# Patient Record
Sex: Female | Born: 1975 | Race: Black or African American | Hispanic: No | Marital: Single | Smoking: Former smoker
Health system: Southern US, Community
[De-identification: ages and names within clinical notes are randomized; demographics above are authoritative.]

## PROBLEM LIST (undated history)

## (undated) ENCOUNTER — Emergency Department (HOSPITAL_COMMUNITY): Discharge status: Absent without leave | Payer: Self-pay

## (undated) DIAGNOSIS — I10 Essential (primary) hypertension: Secondary | ICD-10-CM

## (undated) DIAGNOSIS — G43909 Migraine, unspecified, not intractable, without status migrainosus: Secondary | ICD-10-CM

## (undated) HISTORY — PX: NO PAST SURGERIES: SHX2092

## (undated) HISTORY — DX: Essential (primary) hypertension: I10

---

## 2012-08-14 ENCOUNTER — Emergency Department (HOSPITAL_COMMUNITY): Payer: Self-pay

## 2012-08-14 ENCOUNTER — Emergency Department (HOSPITAL_COMMUNITY)
Admission: EM | Admit: 2012-08-14 | Discharge: 2012-08-14 | Disposition: A | Discharge status: Home / Self Care | Payer: Self-pay | Attending: Emergency Medicine | Admitting: Emergency Medicine

## 2012-08-14 ENCOUNTER — Encounter (HOSPITAL_COMMUNITY): Payer: Self-pay | Admitting: Emergency Medicine

## 2012-08-14 DIAGNOSIS — Z3202 Encounter for pregnancy test, result negative: Secondary | ICD-10-CM | POA: Insufficient documentation

## 2012-08-14 DIAGNOSIS — N949 Unspecified condition associated with female genital organs and menstrual cycle: Secondary | ICD-10-CM | POA: Insufficient documentation

## 2012-08-14 DIAGNOSIS — R109 Unspecified abdominal pain: Secondary | ICD-10-CM | POA: Insufficient documentation

## 2012-08-14 LAB — CBC WITH DIFFERENTIAL/PLATELET
Basophils Absolute: 0.1 10*3/uL (ref 0.0–0.1)
Eosinophils Relative: 3 % (ref 0–5)
HCT: 31.1 % — ABNORMAL LOW (ref 36.0–46.0)
Lymphocytes Relative: 51 % — ABNORMAL HIGH (ref 12–46)
Lymphs Abs: 2.6 10*3/uL (ref 0.7–4.0)
MCV: 86.6 fL (ref 78.0–100.0)
Neutro Abs: 1.9 10*3/uL (ref 1.7–7.7)
Platelets: 290 10*3/uL (ref 150–400)
RBC: 3.59 MIL/uL — ABNORMAL LOW (ref 3.87–5.11)
RDW: 14.1 % (ref 11.5–15.5)
WBC: 5.2 10*3/uL (ref 4.0–10.5)

## 2012-08-14 LAB — COMPREHENSIVE METABOLIC PANEL
ALT: 13 U/L (ref 0–35)
AST: 22 U/L (ref 0–37)
Alkaline Phosphatase: 43 U/L (ref 39–117)
CO2: 23 mEq/L (ref 19–32)
Calcium: 9 mg/dL (ref 8.4–10.5)
Chloride: 105 mEq/L (ref 96–112)
GFR calc Af Amer: >90 mL/min (ref >90)
GFR calc non Af Amer: 89 mL/min — ABNORMAL LOW (ref >90)
Glucose, Bld: 98 mg/dL (ref 70–99)
Sodium: 137 mEq/L (ref 135–145)
Total Bilirubin: 0.2 mg/dL — ABNORMAL LOW (ref 0.3–1.2)

## 2012-08-14 LAB — WET PREP, GENITAL
Clue Cells Wet Prep HPF POC: NONE SEEN
Trich, Wet Prep: NONE SEEN
Yeast Wet Prep HPF POC: NONE SEEN

## 2012-08-14 LAB — URINALYSIS, ROUTINE W REFLEX MICROSCOPIC
Bilirubin Urine: NEGATIVE
Glucose, UA: NEGATIVE mg/dL
Hgb urine dipstick: NEGATIVE
Specific Gravity, Urine: 1.014 (ref 1.005–1.030)
Urobilinogen, UA: 0.2 mg/dL (ref 0.0–1.0)
pH: 7 (ref 5.0–8.0)

## 2012-08-14 LAB — GC/CHLAMYDIA PROBE AMP
CT Probe RNA: NEGATIVE
GC Probe RNA: NEGATIVE

## 2012-08-14 MED ORDER — HYDROMORPHONE HCL PF 1 MG/ML IJ SOLN
INTRAMUSCULAR | Status: AC
  Administered 2012-08-14: 02:00:00
  Filled 2012-08-14: qty 1

## 2012-08-14 MED ORDER — ONDANSETRON HCL 4 MG/2ML IJ SOLN
4.0000 mg | Freq: Once | INTRAMUSCULAR | Status: AC
  Administered 2012-08-14: 4 mg via INTRAVENOUS
  Filled 2012-08-14: qty 2

## 2012-08-14 MED ORDER — HYDROCODONE-ACETAMINOPHEN 5-325 MG PO TABS: 2.0000 | ORAL_TABLET | ORAL | Status: DC | PRN

## 2012-08-14 MED ORDER — PROMETHAZINE HCL 25 MG PO TABS: 25.0000 mg | ORAL_TABLET | Freq: Four times a day (QID) | ORAL | Status: DC | PRN

## 2012-08-14 MED ORDER — SODIUM CHLORIDE 0.9 % IV SOLN
Freq: Once | INTRAVENOUS | Status: AC
  Administered 2012-08-14: 01:00:00 via INTRAVENOUS

## 2012-08-14 NOTE — ED Provider Notes (Signed)
CSN: [id number]     Arrival date & time 08/14/12  0011 History     First MD Initiated Contact with Patient 08/14/12 0155     Chief Complaint  Patient presents with  . Abdominal Pain   (Consider location/radiation/quality/duration/timing/severity/associated sxs/prior Treatment) HPI  37 year old female presents complaining of pelvic and abdominal pain. Patient reports acute onset of sharp stabbing pain from her suprapubic region which radiates up to the upper abdomen tonight while she was sleeping. The pain is intense, worsening with movement. She has never felt this pain before. No specific treatment tried. No complaints of fever, chills, nausea, vomiting, diarrhea, chest pain, shortness of breath, back pain, dysuria, hematuria, hematochezia, melena, or rash. Denies any recent trauma. Denies any pain with sexual activities. Last menstrual period was a week ago. Patient is sexually active.  No past medical history on file. No past surgical history on file. No family history on file. History  Substance Use Topics  . Smoking status: Not on file  . Smokeless tobacco: Not on file  . Alcohol Use: Not on file   OB History   No data available     Review of Systems  All other systems reviewed and are negative.    Allergies  Review of patient's allergies indicates no known allergies.  Home Medications  No current outpatient prescriptions on file. BP 137/96  Pulse 50  Temp(Src) 98.1 F (36.7 C) (Oral)  Resp 18  SpO2 100% Physical Exam  Nursing note and vitals reviewed. Constitutional: She appears well-developed and well-nourished. No distress.  HENT:  Head: Normocephalic and atraumatic.  Eyes: Conjunctivae are normal.  Neck: Normal range of motion. Neck supple.  Cardiovascular: Normal rate and regular rhythm.   Pulmonary/Chest: Effort normal and breath sounds normal. She exhibits no tenderness.  Abdominal: Soft. There is tenderness (diffuse abdominal tenderness most  significant to suprapubic region with guarding but without rebound tenderness. No peritoneal sign.). There is guarding. There is no rebound.  Genitourinary: Vagina normal and uterus normal. There is no rash or lesion on the right labia. There is no rash or lesion on the left labia. Cervix exhibits motion tenderness. Cervix exhibits no discharge. Right adnexum displays tenderness. Right adnexum displays no mass. Left adnexum displays tenderness. Left adnexum displays no mass. No erythema, tenderness or bleeding around the vagina. No vaginal discharge found.  Chaperone present  Lymphadenopathy:       Right: No inguinal adenopathy present.       Left: No inguinal adenopathy present.    ED Course   Procedures (including critical care time)  Patient presents with low abdominal pain radiates up to the upper abdomen. She is moderate tender on exam most significant to the suprapubic region. Pain is reproducible on pelvic exam is well, to both adnexa and uterus. Plan to obtain pelvic ultrasound to rule out overian torsion, tubo-ovarian abscess. Low suspicion for biliary disease, or appendicitis at this time. Patient is afebrile with stable normal in sign. Patient's pain improves after receiving pain medication in ED.  3:38 AM Wet prep result unremarkable. Pregnancy test is negative. UA shows no evidence of urinary tract infection. Patient has normal white count. Mild anemia with hemoglobin of 10.2. Her electrolytes are normal. Normal lipase. Pelvic ultrasound shows no acute finding, specifically no evidence of ovarian torsion or tubo-ovarian abscess. Patient reports pain has improved. Patient still had some periumbilical pain on reexamination. No guarding no rebound. Although this may be early onset appendicitis, I do not think CT scan is  warranted at this time. Patient agrees to return for serial abdominal exam in 12-24 hours if symptoms worsen.  Labs Reviewed  WET PREP, GENITAL - Abnormal; Notable for the  following:    WBC, Wet Prep HPF POC FEW (*)    All other components within normal limits  CBC WITH DIFFERENTIAL - Abnormal; Notable for the following:    RBC 3.59 (*)    Hemoglobin 10.2 (*)    HCT 31.1 (*)    Neutrophils Relative % 37 (*)    Lymphocytes Relative 51 (*)    All other components within normal limits  COMPREHENSIVE METABOLIC PANEL - Abnormal; Notable for the following:    Albumin 3.4 (*)    Total Bilirubin 0.2 (*)    GFR calc non Af Amer 89 (*)    All other components within normal limits  GC/CHLAMYDIA PROBE AMP  LIPASE, BLOOD  PREGNANCY, URINE  URINALYSIS, ROUTINE W REFLEX MICROSCOPIC  URINALYSIS, ROUTINE W REFLEX MICROSCOPIC  POCT I-STAT TROPONIN I   US Transvaginal Non-ob  08/14/2012   *RADIOLOGY REPORT*  Clinical Data: Pelvic pain.  TRANSABDOMINAL AND TRANSVAGINAL ULTRASOUND OF PELVIS DOPPLER ULTRASOUND OF OVARIES  Technique:  Both transabdominal and transvaginal ultrasound examinations of the pelvis were performed. Transabdominal technique was performed for global imaging of the pelvis including uterus, ovaries, adnexal regions, and pelvic cul-de-sac.  Color and duplex Doppler ultrasound was utilized to evaluate blood flow to the ovaries.  It was necessary to proceed with endovaginal exam following the transabdominal exam to visualize the right ovary.  Comparison:  No priors.  Findings:  Uterus: Retroverted uterus, normal in size and echotexture measuring 7.8 x 5.1 x 5.9 cm.  Endometrium: 5.8 mm thick.  Right ovary:  Normal size and echotexture measuring 3.2 x 1.7 x 2.3 cm.  Multiple small follicles.  Normal arterial and venous flow to the ovary.  Left ovary: Normal size and echotexture measuring 4.0 x 2.1 x 2.8 cm.  Multiple small follicles.  Normal arterial and venous flow to the ovary.  Other findings: Small volume of free fluid the cul-de-sac.  IMPRESSION: 1. Small volume of free fluid the cul-de-sac is presumably physiologic in this young female patient.  No other acute  findings. 2.  Retroverted uterus. 3.  Normal appearance of the ovaries bilaterally, with normal arterial and venous flow.   Original Report Authenticated By: Trudie Reed, M.D.   US Pelvis Complete  08/14/2012   *RADIOLOGY REPORT*  Clinical Data: Pelvic pain.  TRANSABDOMINAL AND TRANSVAGINAL ULTRASOUND OF PELVIS DOPPLER ULTRASOUND OF OVARIES  Technique:  Both transabdominal and transvaginal ultrasound examinations of the pelvis were performed. Transabdominal technique was performed for global imaging of the pelvis including uterus, ovaries, adnexal regions, and pelvic cul-de-sac.  Color and duplex Doppler ultrasound was utilized to evaluate blood flow to the ovaries.  It was necessary to proceed with endovaginal exam following the transabdominal exam to visualize the right ovary.  Comparison:  No priors.  Findings:  Uterus: Retroverted uterus, normal in size and echotexture measuring 7.8 x 5.1 x 5.9 cm.  Endometrium: 5.8 mm thick.  Right ovary:  Normal size and echotexture measuring 3.2 x 1.7 x 2.3 cm.  Multiple small follicles.  Normal arterial and venous flow to the ovary.  Left ovary: Normal size and echotexture measuring 4.0 x 2.1 x 2.8 cm.  Multiple small follicles.  Normal arterial and venous flow to the ovary.  Other findings: Small volume of free fluid the cul-de-sac.  IMPRESSION: 1. Small volume of free  fluid the cul-de-sac is presumably physiologic in this young female patient.  No other acute findings. 2.  Retroverted uterus. 3.  Normal appearance of the ovaries bilaterally, with normal arterial and venous flow.   Original Report Authenticated By: Trudie Reed, M.D.   [place] Art/ven Flow Abd Pelv Doppler  08/14/2012   *RADIOLOGY REPORT*  Clinical Data: Pelvic pain.  TRANSABDOMINAL AND TRANSVAGINAL ULTRASOUND OF PELVIS DOPPLER ULTRASOUND OF OVARIES  Technique:  Both transabdominal and transvaginal ultrasound examinations of the pelvis were performed. Transabdominal technique was performed for  global imaging of the pelvis including uterus, ovaries, adnexal regions, and pelvic cul-de-sac.  Color and duplex Doppler ultrasound was utilized to evaluate blood flow to the ovaries.  It was necessary to proceed with endovaginal exam following the transabdominal exam to visualize the right ovary.  Comparison:  No priors.  Findings:  Uterus: Retroverted uterus, normal in size and echotexture measuring 7.8 x 5.1 x 5.9 cm.  Endometrium: 5.8 mm thick.  Right ovary:  Normal size and echotexture measuring 3.2 x 1.7 x 2.3 cm.  Multiple small follicles.  Normal arterial and venous flow to the ovary.  Left ovary: Normal size and echotexture measuring 4.0 x 2.1 x 2.8 cm.  Multiple small follicles.  Normal arterial and venous flow to the ovary.  Other findings: Small volume of free fluid the cul-de-sac.  IMPRESSION: 1. Small volume of free fluid the cul-de-sac is presumably physiologic in this young female patient.  No other acute findings. 2.  Retroverted uterus. 3.  Normal appearance of the ovaries bilaterally, with normal arterial and venous flow.   Original Report Authenticated By: Trudie Reed, M.D.   1. Abdominal pain     MDM  BP 135/89  Pulse 83  Temp(Src) 98.6 F (37 C) (Oral)  Resp 16  SpO2 98%  I have reviewed nursing notes and vital signs. I personally reviewed the imaging tests through PACS system  I reviewed available ER/hospitalization records thought the EMR   Fayrene Helper, [place] 08/14/12 0413

## 2012-08-14 NOTE — ED Notes (Signed)
See paper documentations during computer downtime.

## 2012-08-14 NOTE — ED Notes (Signed)
Brought in by EMS from home with c/o abdominal pain with N/V/D.  Per EMS, pt reported that she woke up an hour ago with severe epigastric pain and started to vomit; pt reports she went to bed feeling fine.  Pt presents to ED in fetal position, c/o severe epigastric pain.

## 2012-08-14 NOTE — ED Notes (Signed)
[id number]<AV> Expected date:<BR> Expected time:<BR> Means of arrival:<BR> Comments:<BR> EMS/female with upper abdominal pain

## 2012-08-14 NOTE — ED Provider Notes (Signed)
Medical screening examination/treatment/procedure(s) were performed by non-physician practitioner and as supervising physician I was immediately available for consultation/collaboration.  John-Adam Avett Reineck, M.D.     John-Adam Khaliel Morey, MD 08/14/12 0600 

## 2012-09-08 ENCOUNTER — Encounter (HOSPITAL_COMMUNITY): Payer: Self-pay | Admitting: *Deleted

## 2012-09-08 ENCOUNTER — Emergency Department (HOSPITAL_COMMUNITY)
Admission: EM | Admit: 2012-09-08 | Discharge: 2012-09-08 | Disposition: A | Discharge status: Home / Self Care | Payer: Medicaid Other | Attending: Emergency Medicine | Admitting: Emergency Medicine

## 2012-09-08 DIAGNOSIS — R112 Nausea with vomiting, unspecified: Secondary | ICD-10-CM | POA: Insufficient documentation

## 2012-09-08 DIAGNOSIS — F17200 Nicotine dependence, unspecified, uncomplicated: Secondary | ICD-10-CM | POA: Insufficient documentation

## 2012-09-08 DIAGNOSIS — R42 Dizziness and giddiness: Secondary | ICD-10-CM | POA: Insufficient documentation

## 2012-09-08 DIAGNOSIS — H538 Other visual disturbances: Secondary | ICD-10-CM | POA: Insufficient documentation

## 2012-09-08 DIAGNOSIS — G43809 Other migraine, not intractable, without status migrainosus: Secondary | ICD-10-CM | POA: Insufficient documentation

## 2012-09-08 DIAGNOSIS — Z79899 Other long term (current) drug therapy: Secondary | ICD-10-CM | POA: Insufficient documentation

## 2012-09-08 DIAGNOSIS — G43909 Migraine, unspecified, not intractable, without status migrainosus: Secondary | ICD-10-CM

## 2012-09-08 DIAGNOSIS — R110 Nausea: Secondary | ICD-10-CM

## 2012-09-08 DIAGNOSIS — H53149 Visual discomfort, unspecified: Secondary | ICD-10-CM | POA: Insufficient documentation

## 2012-09-08 DIAGNOSIS — Z8669 Personal history of other diseases of the nervous system and sense organs: Secondary | ICD-10-CM | POA: Insufficient documentation

## 2012-09-08 HISTORY — DX: Migraine, unspecified, not intractable, without status migrainosus: G43.909

## 2012-09-08 MED ORDER — METOCLOPRAMIDE HCL 5 MG/ML IJ SOLN
10.0000 mg | Freq: Once | INTRAMUSCULAR | Status: AC
  Administered 2012-09-08: 10 mg via INTRAVENOUS
  Filled 2012-09-08: qty 2

## 2012-09-08 MED ORDER — HYDROMORPHONE HCL PF 1 MG/ML IJ SOLN
1.0000 mg | Freq: Once | INTRAMUSCULAR | Status: AC
  Administered 2012-09-08: 1 mg via INTRAVENOUS
  Filled 2012-09-08: qty 1

## 2012-09-08 MED ORDER — SODIUM CHLORIDE 0.9 % IV BOLUS (SEPSIS)
1000.0000 mL | Freq: Once | INTRAVENOUS | Status: AC
  Administered 2012-09-08: 1000 mL via INTRAVENOUS

## 2012-09-08 NOTE — ED Notes (Signed)
Pt to ED for evaluation of a migraine since yesterday- pt has hx of migraines, took [facility] powder at home without relief.  Headache is located in the back of pt head- N/V associated with symptoms.  Denies any changes in vision- alert and oriented X 4 at present.  IV in place- will continue to monitor pt.

## 2012-09-08 NOTE — ED Provider Notes (Signed)
CSN: [id number]     Arrival date & time 09/08/12  1755 History   First MD Initiated Contact with Patient 09/08/12 1818     Chief Complaint  Patient presents with  . Migraine  . Emesis   (Consider location/radiation/quality/duration/timing/severity/associated sxs/prior Treatment) The history is provided by the patient and medical records.   Patient presents to the ED for migraine headache. Patient states headache started last night, progressively worsening, associated with some nausea, vomiting, lightheadedness, photophobia, and intermittently blurred vision.  States these sx occur with her migraines. Denies any aura, photophobia, tinnitus, confusion, difficulty concentrating, changes in speech, numbness or paresthesias of extremities. Patient has a history of migraines, not currently on any maintenance medications. She has taken [facility] powder at home without significant relief.  No fevers, sweats, or chills.  No neck pain or stiffness.  Past Medical History  Diagnosis Date  . Migraine    History reviewed. No pertinent past surgical history. No family history on file. History  Substance Use Topics  . Smoking status: Current Some Day Smoker  . Smokeless tobacco: Never Used  . Alcohol Use: No   OB History   Grav Para Term Preterm Abortions TAB SAB Ect Mult Living            1     Review of Systems  Gastrointestinal: Positive for nausea and vomiting.  Neurological: Positive for headaches.  All other systems reviewed and are negative.    Allergies  Review of patient's allergies indicates no known allergies.  Home Medications   Current Outpatient Rx  Name  Route  Sig  Dispense  Refill  . Aspirin-Salicylamide-Caffeine (BC HEADACHE POWDER PO)   Oral   Take 1 Package by mouth daily as needed (for migraines).          BP 161/101  Pulse 75  Temp(Src) 98.5 F (36.9 C) (Oral)  Resp 20  SpO2 99%  Physical Exam  Nursing note and vitals reviewed. Constitutional: She is oriented  to person, place, and time. She appears well-developed and well-nourished. No distress.  HENT:  Head: Normocephalic and atraumatic.  Mouth/Throat: Oropharynx is clear and moist.  Eyes: Conjunctivae and EOM are normal. Pupils are equal, round, and reactive to light.  Neck: Normal range of motion and full passive range of motion without pain. Neck supple. No rigidity.  No meningeal signs  Cardiovascular: Normal rate, regular rhythm and normal heart sounds.   Pulmonary/Chest: Effort normal and breath sounds normal. No respiratory distress. She has no wheezes.  Abdominal: Soft. Bowel sounds are normal. There is no tenderness. There is no guarding.  Musculoskeletal: Normal range of motion.  Neurological: She is alert and oriented to person, place, and time. She has normal strength. She displays no tremor. No cranial nerve deficit or sensory deficit. She displays no seizure activity. Gait normal.  CN grossly intact, moves all extremities appropriately without ataxia, no focal neuro deficits or facial droop appreciated; normal gait  Skin: Skin is warm and dry. She is not diaphoretic.  Psychiatric: She has a normal mood and affect.    ED Course  Procedures (including critical care time) Labs Review Labs Reviewed - No data to display Imaging Review No results found.  MDM   1. Migraine headache   2. Nausea     At initial evaluation pt in room with friend, talking on the phone, NAD.  No active vomiting in the ED.  Headache without associated focal neuro deficits-- i doubt TIA, stroke, SAH, ICH, or meningitis.  Will give migraine cocktail and reassess.  7:45 PM Pt reassessed.  Headache resolved, will initiate PO trial.   Pt tolerated PO without difficulty.  Pt afebrile, non-toxic appearing, NAD, VS stable- ok for discharge.  Instructed to take OTC Excedrin migraine if headache rebounds.  Discussed plan with pt, they agreed.  Return precautions advised.  Garlon Hatchet, PA-C 09/09/12 [phone]

## 2012-09-08 NOTE — ED Notes (Signed)
Pt has history of migraines and reports frontal headache last nite and vomiting

## 2012-09-09 NOTE — ED Provider Notes (Signed)
Medical screening examination/treatment/procedure(s) were performed by non-physician practitioner and as supervising physician I was immediately available for consultation/collaboration.  Aylen Rambert, MD 09/09/12 0107 

## 2012-12-15 ENCOUNTER — Emergency Department (HOSPITAL_COMMUNITY)
Admission: EM | Admit: 2012-12-15 | Discharge: 2012-12-15 | Disposition: A | Discharge status: Home / Self Care | Payer: Medicaid Other | Attending: Emergency Medicine | Admitting: Emergency Medicine

## 2012-12-15 ENCOUNTER — Encounter (HOSPITAL_COMMUNITY): Payer: Self-pay | Admitting: Emergency Medicine

## 2012-12-15 DIAGNOSIS — G43909 Migraine, unspecified, not intractable, without status migrainosus: Secondary | ICD-10-CM | POA: Insufficient documentation

## 2012-12-15 DIAGNOSIS — Z3202 Encounter for pregnancy test, result negative: Secondary | ICD-10-CM | POA: Insufficient documentation

## 2012-12-15 DIAGNOSIS — H53149 Visual discomfort, unspecified: Secondary | ICD-10-CM | POA: Insufficient documentation

## 2012-12-15 DIAGNOSIS — Z8669 Personal history of other diseases of the nervous system and sense organs: Secondary | ICD-10-CM | POA: Insufficient documentation

## 2012-12-15 DIAGNOSIS — R112 Nausea with vomiting, unspecified: Secondary | ICD-10-CM | POA: Insufficient documentation

## 2012-12-15 MED ORDER — PROCHLORPERAZINE EDISYLATE 5 MG/ML IJ SOLN: 10.0000 mg | Freq: Once | INTRAMUSCULAR | Status: DC

## 2012-12-15 MED ORDER — DIPHENHYDRAMINE HCL 50 MG/ML IJ SOLN: 25.0000 mg | Freq: Once | INTRAMUSCULAR | Status: DC

## 2012-12-15 MED ORDER — PROCHLORPERAZINE EDISYLATE 5 MG/ML IJ SOLN
10.0000 mg | Freq: Once | INTRAMUSCULAR | Status: AC
  Administered 2012-12-15: 10 mg via INTRAVENOUS
  Filled 2012-12-15: qty 2

## 2012-12-15 MED ORDER — DIPHENHYDRAMINE HCL 50 MG/ML IJ SOLN
25.0000 mg | Freq: Once | INTRAMUSCULAR | Status: AC
  Administered 2012-12-15: 25 mg via INTRAVENOUS
  Filled 2012-12-15: qty 1

## 2012-12-15 MED ORDER — SODIUM CHLORIDE 0.9 % IV BOLUS (SEPSIS)
1000.0000 mL | Freq: Once | INTRAVENOUS | Status: AC
  Administered 2012-12-15: 1000 mL via INTRAVENOUS

## 2012-12-15 NOTE — ED Provider Notes (Signed)
CSN: [id number]     Arrival date & time 12/15/12  4098 History   First MD Initiated Contact with Patient 12/15/12 [phone]     Chief Complaint  Patient presents with  . Migraine   (Consider location/radiation/quality/duration/timing/severity/associated sxs/prior Treatment) HPI Comments: Patient is a 37 yo F PMHx significant for migraines presenting to the ED for a moderate to severe migraine that started yesterday. The patient states her migraine is located on the left side of her head and feels "like someone is hammering my head." She endorses associated nausea and non-bloody non-bilious emesis yesterday. She also endorses photophobia. No alleviating factors. The patient states the last time she had a headache this bad was 3 weeks ago. Patient states this migraine feels like previous migraines. She denies any fevers. Patient is requesting a pregnancy test, LMP November 1st.  Patient is a 37 y.o. female presenting with migraines.  Migraine Associated symptoms include headaches and nausea. Pertinent negatives include no chest pain, chills, fever, neck pain, numbness, vomiting or weakness.    Past Medical History  Diagnosis Date  . Migraine    History reviewed. No pertinent past surgical history. History reviewed. No pertinent family history. History  Substance Use Topics  . Smoking status: Never Smoker   . Smokeless tobacco: Never Used  . Alcohol Use: No   OB History   Grav Para Term Preterm Abortions TAB SAB Ect Mult Living            1     Review of Systems  Constitutional: Negative for fever and chills.  Eyes: Positive for photophobia.  Respiratory: Negative for shortness of breath.   Cardiovascular: Negative for chest pain.  Gastrointestinal: Positive for nausea. Negative for vomiting.  Musculoskeletal: Negative for back pain and neck pain.  Neurological: Positive for headaches. Negative for syncope, weakness, light-headedness and numbness.  All other systems reviewed and are  negative.    Allergies  Review of patient's allergies indicates no known allergies.  Home Medications   Current Outpatient Rx  Name  Route  Sig  Dispense  Refill  . PRESCRIPTION MEDICATION   Oral   Take 1 tablet by mouth 3 (three) times daily.          BP 131/89  Pulse 55  Temp(Src) 98.1 F (36.7 C) (Oral)  Resp 20  Ht 5\' 9"  (1.753 m)  Wt 138 lb (62.596 kg)  BMI 20.37 kg/m2  SpO2 100%  LMP 11/02/2012 Physical Exam  Constitutional: She is oriented to person, place, and time. She appears well-developed and well-nourished. No distress.  HENT:  Head: Normocephalic and atraumatic.  Right Ear: External ear normal.  Left Ear: External ear normal.  Nose: Nose normal.  Mouth/Throat: Oropharynx is clear and moist. No oropharyngeal exudate.  Eyes: Conjunctivae and EOM are normal. Pupils are equal, round, and reactive to light.  Neck: Normal range of motion. Neck supple.  Cardiovascular: Normal rate, regular rhythm, normal heart sounds and intact distal pulses.   Pulmonary/Chest: Effort normal and breath sounds normal. No respiratory distress.  Abdominal: Soft. There is no tenderness.  Neurological: She is alert and oriented to person, place, and time. She has normal strength. No cranial nerve deficit or sensory deficit. Gait normal. GCS eye subscore is 4. GCS verbal subscore is 5. GCS motor subscore is 6.  No pronator drift. Bilateral heel-knee-shin intact.  Skin: Skin is warm and dry. She is not diaphoretic.    ED Course  Procedures (including critical care time) Medications  diphenhydrAMINE (BENADRYL)  injection 25 mg (25 mg Intravenous Given 12/15/12 0958)  prochlorperazine (COMPAZINE) injection 10 mg (10 mg Intravenous Given 12/15/12 0958)  sodium chloride 0.9 % bolus 1,000 mL (0 mLs Intravenous Stopped 12/15/12 1136)    Labs Review Labs Reviewed  PREGNANCY, URINE   Imaging Review No results found.  EKG Interpretation   None       MDM   1. Migraine     Afebrile, NAD, non-toxic appearing, AAOx4.  Pt HA treated and improved while in ED.  Presentation is like pts typical HA and non concerning for [facility], ICH, Meningitis, or temporal arteritis. Pt is afebrile with no focal neuro deficits, nuchal rigidity, or change in vision. Pt is to follow up with PCP to discuss prophylactic medication. Pt verbalizes understanding and is agreeable with plan to dc. Patient d/w with Dr. Jeraldine Loots, agrees with plan.       Jeannetta Ellis, PA-C 12/15/12 1243

## 2012-12-15 NOTE — ED Notes (Signed)
Pt arrives to ed co Migraine HA x1 day.  Pt sts hx of same.  Pt not know triggers.  Pt sts pain only on left side of head.  Pt caox4, pmsx4, nad.  Pt denies recent illness/injury.

## 2012-12-15 NOTE — ED Provider Notes (Signed)
  Medical screening examination/treatment/procedure(s) were performed by non-physician practitioner and as supervising physician I was immediately available for consultation/collaboration.  EKG Interpretation   None          Gerhard Munch, MD 12/15/12 1601

## 2013-05-12 ENCOUNTER — Emergency Department (HOSPITAL_COMMUNITY)
Admission: EM | Admit: 2013-05-12 | Discharge: 2013-05-12 | Discharge status: Against Medical Advice | Payer: Medicaid Other | Attending: Emergency Medicine | Admitting: Emergency Medicine

## 2013-05-12 ENCOUNTER — Encounter (HOSPITAL_COMMUNITY): Payer: Self-pay | Admitting: Emergency Medicine

## 2013-05-12 DIAGNOSIS — R350 Frequency of micturition: Secondary | ICD-10-CM | POA: Insufficient documentation

## 2013-05-12 DIAGNOSIS — R109 Unspecified abdominal pain: Secondary | ICD-10-CM | POA: Insufficient documentation

## 2013-05-12 LAB — URINALYSIS, ROUTINE W REFLEX MICROSCOPIC
Bilirubin Urine: NEGATIVE
Glucose, UA: NEGATIVE mg/dL
KETONES UR: NEGATIVE mg/dL
NITRITE: NEGATIVE
PH: 6.5 (ref 5.0–8.0)
Protein, ur: NEGATIVE mg/dL
SPECIFIC GRAVITY, URINE: 1.022 (ref 1.005–1.030)
UROBILINOGEN UA: 1 mg/dL (ref 0.0–1.0)

## 2013-05-12 LAB — URINE MICROSCOPIC-ADD ON

## 2013-05-12 LAB — POC URINE PREG, ED: Preg Test, Ur: NEGATIVE

## 2013-05-12 NOTE — ED Notes (Signed)
Present with lower abdominal pain began yesterday, describes pain as heaviness. Pain is also in lower back. Reports urinary frequency, dnies pain with urination. denis vaginal discharge. LAst BM 05/10/13-normal.

## 2013-05-12 NOTE — ED Notes (Signed)
Pt reports she has to leave due the fact that she has to pick up son. No one else able to do so. Plans to return for tx.

## 2013-05-18 ENCOUNTER — Emergency Department (HOSPITAL_COMMUNITY)
Admission: EM | Admit: 2013-05-18 | Discharge: 2013-05-19 | Disposition: A | Discharge status: Home / Self Care | Payer: Medicaid Other | Attending: Emergency Medicine | Admitting: Emergency Medicine

## 2013-05-18 ENCOUNTER — Encounter (HOSPITAL_COMMUNITY): Payer: Self-pay | Admitting: Emergency Medicine

## 2013-05-18 DIAGNOSIS — Z8679 Personal history of other diseases of the circulatory system: Secondary | ICD-10-CM | POA: Insufficient documentation

## 2013-05-18 DIAGNOSIS — Z3202 Encounter for pregnancy test, result negative: Secondary | ICD-10-CM | POA: Insufficient documentation

## 2013-05-18 DIAGNOSIS — N390 Urinary tract infection, site not specified: Secondary | ICD-10-CM

## 2013-05-18 LAB — URINALYSIS, ROUTINE W REFLEX MICROSCOPIC
Bilirubin Urine: NEGATIVE
Glucose, UA: NEGATIVE mg/dL
Ketones, ur: NEGATIVE mg/dL
Nitrite: NEGATIVE
PROTEIN: NEGATIVE mg/dL
Specific Gravity, Urine: 1.024 (ref 1.005–1.030)
Urobilinogen, UA: 1 mg/dL (ref 0.0–1.0)
pH: 6 (ref 5.0–8.0)

## 2013-05-18 LAB — URINE MICROSCOPIC-ADD ON

## 2013-05-18 LAB — PREGNANCY, URINE: Preg Test, Ur: NEGATIVE

## 2013-05-18 MED ORDER — PROMETHAZINE HCL 25 MG PO TABS: 25.0000 mg | ORAL_TABLET | Freq: Four times a day (QID) | ORAL | Status: DC | PRN

## 2013-05-18 MED ORDER — CEPHALEXIN 500 MG PO CAPS: 500.0000 mg | ORAL_CAPSULE | Freq: Four times a day (QID) | ORAL | Status: DC

## 2013-05-18 NOTE — ED Notes (Signed)
Pt states she is having pain in her left and right side with urination  Pt states she feels like she has to empty her bladder every few minutes but only goes small amts  Pt states she has burning with urination as well

## 2013-05-18 NOTE — Discharge Instructions (Signed)
1. Medications: phenergan, keflex, usual home medications 2. Treatment: rest, drink plenty of fluids,  3. Follow Up: Please followup with your primary doctor for discussion of your diagnoses and further evaluation after today's visit; if you do not have a primary care doctor use the resource guide provided to find one;    Urinary Tract Infection Urinary tract infections (UTIs) can develop anywhere along your urinary tract. Your urinary tract is your body's drainage system for removing wastes and extra water. Your urinary tract includes two kidneys, two ureters, a bladder, and a urethra. Your kidneys are a pair of bean-shaped organs. Each kidney is about the size of your fist. They are located below your ribs, one on each side of your spine. CAUSES Infections are caused by microbes, which are microscopic organisms, including fungi, viruses, and bacteria. These organisms are so small that they can only be seen through a microscope. Bacteria are the microbes that most commonly cause UTIs. SYMPTOMS  Symptoms of UTIs may vary by age and gender of the patient and by the location of the infection. Symptoms in young women typically include a frequent and intense urge to urinate and a painful, burning feeling in the bladder or urethra during urination. Older women and men are more likely to be tired, shaky, and weak and have muscle aches and abdominal pain. A fever may mean the infection is in your kidneys. Other symptoms of a kidney infection include pain in your back or sides below the ribs, nausea, and vomiting. DIAGNOSIS To diagnose a UTI, your caregiver will ask you about your symptoms. Your caregiver also will ask to provide a urine sample. The urine sample will be tested for bacteria and white blood cells. White blood cells are made by your body to help fight infection. TREATMENT  Typically, UTIs can be treated with medication. Because most UTIs are caused by a bacterial infection, they usually can be  treated with the use of antibiotics. The choice of antibiotic and length of treatment depend on your symptoms and the type of bacteria causing your infection. HOME CARE INSTRUCTIONS  If you were prescribed antibiotics, take them exactly as your caregiver instructs you. Finish the medication even if you feel better after you have only taken some of the medication.  Drink enough water and fluids to keep your urine clear or pale yellow.  Avoid caffeine, tea, and carbonated beverages. They tend to irritate your bladder.  Empty your bladder often. Avoid holding urine for long periods of time.  Empty your bladder before and after sexual intercourse.  After a bowel movement, women should cleanse from front to back. Use each tissue only once. SEEK MEDICAL CARE IF:   You have back pain.  You develop a fever.  Your symptoms do not begin to resolve within 3 days. SEEK IMMEDIATE MEDICAL CARE IF:   You have severe back pain or lower abdominal pain.  You develop chills.  You have nausea or vomiting.  You have continued burning or discomfort with urination. MAKE SURE YOU:   Understand these instructions.  Will watch your condition.  Will get help right away if you are not doing well or get worse. Document Released: 09/28/2004 Document Revised: 06/20/2011 Document Reviewed: 01/27/2011 [facility] Patient Information 2014 [place].    Emergency Department Resource Guide 1) Find a Doctor and Pay Out of Pocket Although you won't have to find out who is covered by your insurance plan, it is a good idea to ask around and get recommendations.  You will then need to call the office and see if the doctor you have chosen will accept you as a new patient and what types of options they offer for patients who are self-pay. Some doctors offer discounts or will set up payment plans for their patients who do not have insurance, but you will need to ask so you aren't surprised when you get to your  appointment. ° °2) Contact Your Local Health Department °Not all health departments have doctors that can see patients for sick visits, but many do, so it is worth a call to see if yours does. If you don't know where your local health department is, you can check in your phone book. The CDC also has a tool to help you locate your state's health department, and many state websites also have listings of all of their local health departments. ° °3) Find a Walk-in Clinic °If your illness is not likely to be very severe or complicated, you may want to try a walk in clinic. These are popping up all over the country in pharmacies, drugstores, and shopping centers. They're usually staffed by nurse practitioners or physician assistants that have been trained to treat common illnesses and complaints. They're usually fairly quick and inexpensive. However, if you have serious medical issues or chronic medical problems, these are probably not your best option. ° °No Primary Care Doctor: °- Call [facility] at  832-8000 - they can help you locate a primary care doctor that  accepts your insurance, provides certain services, etc. °- Physician Referral Service- 1-800-533-3463 ° °Chronic Pain Problems: °Organization         Address  Phone   Notes  °[facility]  (336) 297-2271 Patients need to be referred by their primary care doctor.  ° °Medication Assistance: °Organization         Address  Phone   Notes  °[place] [facility] [address] °[place], [place] [address] (336) 641-8030 --Must be a resident of [place] °-- Must have NO insurance coverage whatsoever (no Medicaid/ Medicare, etc.) °-- The pt. MUST have a primary care doctor that directs their care regularly and follows them in the community °  °[facility]  (866) 331-1348   °[employer]  (888) 892-1162   ° °Agencies that provide inexpensive medical care: °Organization         Address  Phone   Notes  °[facility]  (336) 832-8035   °[facility]    (336) 832-7272   °[facility] [address] °[place], [place] [address] (336) 832-4777   °[facility] [address], °[place] (336) 271-4999   °[facility]    (336) 373-0678   °[facility]    (336) 272-1050   °[facility] ° [address], [place] Phone:  (336) 832-4444, Fax:  (336) 832-4440 Hours of Operation:  9 am - 6 pm, M-F.  Also accepts Medicaid/Medicare and self-pay.  °[facility] ° [address], [place] Phone: (336) 832-3150, Fax: (336) 832-3151. Hours of Operation:  8:30 am - 5:30 pm, M-F.  Also accepts Medicaid and self-pay.  °[facility] [place] [address], [place] Phone: (336) 878-6027   °[facility] [address], [place], [place] (336)723-1848, Ext. 123 Mondays & Thursdays: 7-9 AM.  First 15 patients are seen on a first come,   first serve basis. °  ° °Medicaid-accepting [place] Providers: ° °Organization         Address  Phone   Notes  °[facility] [address], [place] (336) 641-2100 Also accepts self-pay patients.  °[facility] [address], [place] ° (336) 856-9996   °[facility] [address], [place] (336) 288-8857   °[facility] [address] [place] [address], [place] (336) 299-7000   °Veita Bland [address], [place]  ° (336) 373-1557 Only accepts [place] Access Medicaid patients after they have their name applied to their card.  ° °Self-Pay (no insurance) in [place]: ° °Organization         Address  Phone   Notes  °Sickle Cell Patients, [facility] [address], [place] (336) 832-1970   °[facility] [address], [place] (336) 832-4400   °[facility]  [place] ° [address], [place] (336) 992-4800   °[facility]/Dr. Osei-Bonsu ° [address] [place] [address], [place] or [address], [place] (336) 841-8500 Phone number for both [place] and [place] locations is the same.  °[facility] [address], [place] (336) 299-0000   °[facility] [address] [place] [address], [place] or [address] (336) 852-7530 °(336) 878-2260   °[facility] [address], [place] (336) 350-1642, phone; (336) 294-5005, fax Sees patients 1st and 3rd Saturday of every month.  Must not qualify for public or private insurance (i.e. Medicaid, Medicare, [place] Health Choice, Veterans' Benefits) • Household income should be no more than 200% of the poverty level •The clinic cannot treat you if you are pregnant or think you are pregnant • Sexually transmitted diseases are not treated at the clinic.  ° ° °Dental Care: °Organization         Address  Phone  Notes  °[facility] [facility] [address], [place] (336) 641-6152 Accepts children up to age 21 who are enrolled in Medicaid or [place] Health Choice; pregnant women with a Medicaid card; and children who have applied for Medicaid or [place] Health Choice, but were declined, whose parents can pay a reduced fee at time of service.  °[facility] [place]  [address], [place] (336) 641-7733 Accepts children up to age 21 who are enrolled in Medicaid or [place] Health Choice; pregnant women with a Medicaid card; and children who have applied for Medicaid or [place] Health Choice, but were declined, whose parents can pay a reduced fee at time of service.  °[facility] ° [address], [place] (336) 641-4533 Patients are seen by appointment only. Walk-ins are not accepted. [facility] will see patients 18 years of age and  older. °Monday - Tuesday (8am-5pm) °Most Wednesdays (8:30-5pm) °$30 per visit, cash only  °[facility] ° [address], [place] (336) 641-4533 Patients are seen by appointment only. Walk-ins are not accepted. [facility] will see patients 18 years of age and older. °One Wednesday Evening (Monthly: Volunteer Based).  $30 per visit, cash only  °[facility]  (919) 537-3737 for adults; Children under age 4, call Graduate Pediatric Dentistry at (919) [phone]. Children aged 4-14, please   call (919) 537-3737 to request a pediatric application. ° Dental services are provided in all areas of dental care including fillings, crowns and bridges, complete and partial dentures, implants, gum treatment, root canals, and extractions. Preventive care is also provided. Treatment is provided to both adults and children. °Patients are selected via a lottery and there is often a waiting list. °  °[facility] [address], °[place] ° (336) 763-8833 [email or web address] °  °[facility] [address], [place], [place] (336)723-1848, Ext. 123 Second and Fourth Thursday of each month, opens at 6:30 AM; Clinic ends at 9 AM.  Patients are seen on a first-come first-served basis, and a limited number are seen during each clinic.  ° °[facility] ° [address], [place], [place] (336) 723-7904   Eligibility Requirements °You must have lived in [place], [place], or [place] counties for at least the last three months. °  You cannot be eligible for state or federal sponsored healthcare insurance, including Veterans Administration, Medicaid, or Medicare. °  You generally cannot be eligible for healthcare insurance through your employer.  °  How to apply: °Eligibility screenings are held every Tuesday and Wednesday afternoon from 1:00 pm until 4:00 pm. You do not need an appointment for the interview!  °[facility] [address],  [place], [place] 336-631-2330   °[facility]  336-342-8273   °[facility]  336-703-3100   °[facility]  336-570-6415   ° °Behavioral Health Resources in the Community: °Intensive Outpatient Programs °Organization         Address  Phone  Notes  °[facility] [address], [place], [place] 336-878-6098   °[facility] [address], [place], [place] 336-832-9800   °[facility] [address], [place], [place] ° 336-882-2125   °[facility] [address],  °[place], [place] 1-800-853-5163 or 336-641-4981   °Substance Abuse Resources °Organization         Address  Phone  Notes  °[facility]  336-882-2125   °[facility]  336-784-9470   °[facility]  336-285-9073   °[facility]  336-845-3988   °Residential & Outpatient Substance Abuse Program  1-800-659-3381   °Psychological Services °Organization         Address  Phone  Notes  °[facility]  336- 832-9600   °[facility]  336- 378-7881   °[facility] [address], [place] 1-800-853-5163 or 336-641-4981   ° °Mobile Crisis Teams °Organization         Address  Phone  Notes  °[facility], Mobile Crisis Care Unit  1-877-626-1772   °Assertive °[facility] ° [address] [place], [place] 336-834-9664   °Sharon DeEsch [address] °[place] 336-554-5454   ° °Self-Help/Support Groups °Organization         Address  Phone             Notes  °[facility] - variety of support groups  336- 373-1402 Call for more information  °Narcotics Anonymous (NA), [facility] [address], °[place]  2 meetings at this location  ° °Residential Treatment Programs °Organization         Address  Phone  Notes  °[facility] [address],    °[place]  1-866-801-8205   °[facility] ° [address], [place], [place] 704-293-8524   °[facility] [address], [place] 336-845-3988 Admissions: 8am-3pm M-F  °[facility] [address]    °[place], [place] 336-841-1104   °[facility] [address], [place], [place] 336-379-7146   °[facility] [address]  °[place], [place] 336-285-9073   °[facility] - Intensive Outpatient [address], [place], [place] 336-852-3033   °[facility] [address]  °[place], [place] 1-877-615-2722 or 336-784-9470   °[facility] [address] [place], [place] 336-227-7417 Accepts Medicaid  °[facility] [address]  °[place] 1-800-659-3381 Substance Abuse/Addiction Treatment  ° °[place] Behavioral Health Resources °Organization         Address  Phone  Notes  °[facility]  (888) 581-9988   °Julie Brannon, PhD [address] [place], [place]   (336) 349-5553 or (336) 951-0000   °[facility]   [address] °[place], [place] (336) 349-4454   °[facility] [address], [place], [place] (336) 342-8316 Insurance/Medicaid/sponsorship through [facility]  °[facility] [address]                                    [place], [place] (336) 342-8316 Therapy/tele-psych/case  °[facility] [address]  ° [place], [place] (336) 349-2233    °Dr. Arfeen  (336) 349-4544   °[facility] [place]  [employer] [place] [facility] 1) [address], [place] °2) [address], [place] °3)  [address], [place] (336) 349-3220 °(336) 342-7768 ° °(336) 342-8140   °[place] [facility] (336) 342-1394 or (336) 342-3537 (After Hours)    ° ° ° °

## 2013-05-18 NOTE — ED Provider Notes (Signed)
CSN: [id number]     Arrival date & time 05/18/13  1947 History   First MD Initiated Contact with Patient 05/18/13 2217     Chief Complaint  Patient presents with  . Abdominal Pain     (Consider location/radiation/quality/duration/timing/severity/associated sxs/prior Treatment) Patient is a 38 y.o. female presenting with abdominal pain. The history is provided by the patient and medical records. No language interpreter was used.  Abdominal Pain Associated symptoms: dysuria   Associated symptoms: no chest pain, no constipation, no cough, no diarrhea, no fatigue, no fever, no nausea, no shortness of breath and no vomiting     Krista [name] is a 38 y.o. female  with a hx of migraine presents to the Emergency Department complaining of gradual, persistent, progressively worsening suprapubic abdominal pain onset 24 hours ago with associated dysuria, frequency, urgency.  Patient denies history of UTI. She reports pain is significantly worse with urination.  She denies low back pain or flank pain. She reports one episode of emesis earlier today. She denies fever, chills, headache neck pain, chest pain, shortness of breath, diarrhea, weakness, dizziness, syncope, rash, vaginal discharge, vaginal pain.  Pt denies new sexual partners.    Past Medical History  Diagnosis Date  . Migraine    History reviewed. No pertinent past surgical history. Family History  Problem Relation Age of Onset  . Cancer Mother   . Diabetes Mother   . Hypertension Mother   . Hypertension Father    History  Substance Use Topics  . Smoking status: Never Smoker   . Smokeless tobacco: Never Used  . Alcohol Use: No   OB History   Grav Para Term Preterm Abortions TAB SAB Ect Mult Living            1     Review of Systems  Constitutional: Negative for fever, diaphoresis, appetite change, fatigue and unexpected weight change.  HENT: Negative for mouth sores.   Eyes: Negative for visual disturbance.  Respiratory:  Negative for cough, chest tightness, shortness of breath and wheezing.   Cardiovascular: Negative for chest pain.  Gastrointestinal: Positive for abdominal pain (suprapubic). Negative for nausea, vomiting, diarrhea and constipation.  Endocrine: Negative for polydipsia, polyphagia and polyuria.  Genitourinary: Positive for dysuria, urgency and frequency.  Musculoskeletal: Negative for back pain and neck stiffness.  Skin: Negative for rash.  Allergic/Immunologic: Negative for immunocompromised state.  Neurological: Negative for syncope, light-headedness and headaches.  Hematological: Does not bruise/bleed easily.  Psychiatric/Behavioral: Negative for sleep disturbance. The patient is not nervous/anxious.       Allergies  Review of patient's allergies indicates no known allergies.  Home Medications   Prior to Admission medications   Not on File   BP 115/78  Pulse 92  Temp(Src) 98.4 F (36.9 C) (Oral)  Resp 20  Ht 5\' 9"  (1.753 m)  Wt 150 lb 8 oz (68.266 kg)  BMI 22.21 kg/m2  SpO2 100%  LMP 04/04/2013 Physical Exam  Nursing note and vitals reviewed. Constitutional: She appears well-developed and well-nourished. No distress.  Awake, alert, nontoxic appearance  HENT:  Head: Normocephalic and atraumatic.  Mouth/Throat: Oropharynx is clear and moist. No oropharyngeal exudate.  Eyes: Conjunctivae are normal. No scleral icterus.  Neck: Normal range of motion. Neck supple.  Cardiovascular: Normal rate, regular rhythm, normal heart sounds and intact distal pulses.   No murmur heard. Pulses:      Radial pulses are 2+ on the right side, and 2+ on the left side.  No tachycardia  Pulmonary/Chest:  Effort normal and breath sounds normal. No respiratory distress. She has no wheezes.  Abdominal: Soft. Bowel sounds are normal. She exhibits no distension and no mass. There is tenderness in the suprapubic area. There is no rebound, no guarding and no CVA tenderness.  Mild, suprapubic  tenderness without rigidity, peritoneal signs or guarding No CVA tenderness  Musculoskeletal: Normal range of motion. She exhibits no edema.  Neurological: She is alert.  Speech is clear and goal oriented Moves extremities without ataxia  Skin: Skin is warm and dry. She is not diaphoretic.  Psychiatric: She has a normal mood and affect.    ED Course  Procedures (including critical care time) Labs Review Labs Reviewed  URINALYSIS, ROUTINE W REFLEX MICROSCOPIC - Abnormal; Notable for the following:    APPearance CLOUDY (*)    Hgb urine dipstick TRACE (*)    Leukocytes, UA LARGE (*)    All other components within normal limits  URINE CULTURE  PREGNANCY, URINE  URINE MICROSCOPIC-ADD ON    Imaging Review No results found.   EKG Interpretation None      MDM   Final diagnoses:  UTI (lower urinary tract infection)   Krista [name] presents with Hx and PE consistent with UTI.  UA with evidence of urinary tract infection.  Pt is afebrile, no CVA tenderness, normotensive.  Patient first one episode of emesis however she is tolerating by mouth here in the department. Pt to be dc home with antibiotics and instructions to follow up with PCP if symptoms persist.  It has been determined that no acute conditions requiring further emergency intervention are present at this time. The patient/guardian have been advised of the diagnosis and plan. We have discussed signs and symptoms that warrant return to the ED, such as changes or worsening in symptoms.   Vital signs are stable at discharge.   BP 115/78  Pulse 92  Temp(Src) 98.4 F (36.9 C) (Oral)  Resp 20  Ht 5\' 9"  (1.753 m)  Wt 150 lb 8 oz (68.266 kg)  BMI 22.21 kg/m2  SpO2 100%  LMP 04/04/2013  Patient/guardian has voiced understanding and agreed to follow-up with the PCP or specialist.       Abigail Butts, PA-C 05/18/13 [place], PA-C 05/18/13 [id number]

## 2013-05-21 LAB — URINE CULTURE

## 2013-05-21 NOTE — ED Provider Notes (Signed)
Medical screening examination/treatment/procedure(s) were performed by non-physician practitioner and as supervising physician I was immediately available for consultation/collaboration.   EKG Interpretation None       Richarda Blade, MD 05/21/13 [phone]

## 2013-05-22 ENCOUNTER — Telehealth (HOSPITAL_BASED_OUTPATIENT_CLINIC_OR_DEPARTMENT_OTHER): Payer: Self-pay | Admitting: Emergency Medicine

## 2013-05-22 NOTE — Telephone Encounter (Signed)
Post ED Visit - Positive Culture Follow-up  Culture report reviewed by antimicrobial stewardship pharmacist: []  Wes Alvord, Pharm.D., BCPS []  Heide Guile, Pharm.D., BCPS []  Alycia Rossetti, Pharm.D., BCPS [x]  [place], Pharm.D., BCPS, AAHIVP []  Legrand Como, Pharm.D., BCPS, AAHIVP []  Juliene Pina, Pharm.D.  Positive urine culture Treated with Keflex, organism sensitive to the same and no further patient follow-up is required at this time.  Krista [name] 05/22/2013, 11:54 AM

## 2014-01-28 ENCOUNTER — Encounter (HOSPITAL_COMMUNITY): Payer: Self-pay | Admitting: Emergency Medicine

## 2014-01-28 ENCOUNTER — Emergency Department (HOSPITAL_COMMUNITY)
Admission: EM | Admit: 2014-01-28 | Discharge: 2014-01-28 | Disposition: A | Discharge status: Home / Self Care | Payer: Medicaid Other | Attending: Emergency Medicine | Admitting: Emergency Medicine

## 2014-01-28 DIAGNOSIS — J029 Acute pharyngitis, unspecified: Secondary | ICD-10-CM

## 2014-01-28 DIAGNOSIS — J069 Acute upper respiratory infection, unspecified: Secondary | ICD-10-CM | POA: Insufficient documentation

## 2014-01-28 DIAGNOSIS — Z8679 Personal history of other diseases of the circulatory system: Secondary | ICD-10-CM | POA: Insufficient documentation

## 2014-01-28 DIAGNOSIS — H9209 Otalgia, unspecified ear: Secondary | ICD-10-CM | POA: Insufficient documentation

## 2014-01-28 DIAGNOSIS — B001 Herpesviral vesicular dermatitis: Secondary | ICD-10-CM | POA: Insufficient documentation

## 2014-01-28 DIAGNOSIS — Z79899 Other long term (current) drug therapy: Secondary | ICD-10-CM | POA: Insufficient documentation

## 2014-01-28 LAB — RAPID STREP SCREEN (MED CTR MEBANE ONLY): STREPTOCOCCUS, GROUP A SCREEN (DIRECT): NEGATIVE

## 2014-01-28 MED ORDER — DOCOSANOL 10 % EX CREA: 1.0000 "application " | TOPICAL_CREAM | Freq: Every day | CUTANEOUS | Status: DC

## 2014-01-28 MED ORDER — HYDROCODONE-ACETAMINOPHEN 7.5-325 MG/15ML PO SOLN: 15.0000 mL | ORAL | Status: DC | PRN

## 2014-01-28 MED ORDER — NAPROXEN 500 MG PO TABS
500.0000 mg | ORAL_TABLET | Freq: Once | ORAL | Status: AC
  Administered 2014-01-28: 500 mg via ORAL
  Filled 2014-01-28: qty 1

## 2014-01-28 MED ORDER — NAPROXEN 500 MG PO TABS: 500.0000 mg | ORAL_TABLET | Freq: Two times a day (BID) | ORAL | Status: DC

## 2014-01-28 MED ORDER — SALINE SPRAY 0.65 % NA SOLN
1.0000 | Freq: Once | NASAL | Status: AC
  Administered 2014-01-28: 1 via NASAL
  Filled 2014-01-28: qty 44

## 2014-01-28 NOTE — Discharge Instructions (Signed)
Pharyngitis Pharyngitis is redness, pain, and swelling (inflammation) of your pharynx.  CAUSES  Pharyngitis is usually caused by infection. Most of the time, these infections are from viruses (viral) and are part of a cold. However, sometimes pharyngitis is caused by bacteria (bacterial). Pharyngitis can also be caused by allergies. Viral pharyngitis may be spread from person to person by coughing, sneezing, and personal items or utensils (cups, forks, spoons, toothbrushes). Bacterial pharyngitis may be spread from person to person by more intimate contact, such as kissing.  SIGNS AND SYMPTOMS  Symptoms of pharyngitis include:   Sore throat.   Tiredness (fatigue).   Low-grade fever.   Headache.  Joint pain and muscle aches.  Skin rashes.  Swollen lymph nodes.  Plaque-like film on throat or tonsils (often seen with bacterial pharyngitis). DIAGNOSIS  Your health care provider will ask you questions about your illness and your symptoms. Your medical history, along with a physical exam, is often all that is needed to diagnose pharyngitis. Sometimes, a rapid strep test is done. Other lab tests may also be done, depending on the suspected cause.  TREATMENT  Viral pharyngitis will usually get better in 3-4 days without the use of medicine. Bacterial pharyngitis is treated with medicines that kill germs (antibiotics).  HOME CARE INSTRUCTIONS   Drink enough water and fluids to keep your urine clear or pale yellow.   Only take over-the-counter or prescription medicines as directed by your health care provider:   If you are prescribed antibiotics, make sure you finish them even if you start to feel better.   Do not take aspirin.   Get lots of rest.   Gargle with 8 oz of salt water ( tsp of salt per 1 qt of water) as often as every 1-2 hours to soothe your throat.   Throat lozenges (if you are not at risk for choking) or sprays may be used to soothe your throat. SEEK MEDICAL  CARE IF:   You have large, tender lumps in your neck.  You have a rash.  You cough up green, yellow-brown, or bloody spit. SEEK IMMEDIATE MEDICAL CARE IF:   Your neck becomes stiff.  You drool or are unable to swallow liquids.  You vomit or are unable to keep medicines or liquids down.  You have severe pain that does not go away with the use of recommended medicines.  You have trouble breathing (not caused by a stuffy nose). MAKE SURE YOU:   Understand these instructions.  Will watch your condition.  Will get help right away if you are not doing well or get worse. Document Released: 12/19/2004 Document Revised: 10/09/2012 Document Reviewed: 08/26/2012 [facility] Patient Information 2015 [place], [place]. This information is not intended to replace advice given to you by your health care provider. Make sure you discuss any questions you have with your health care provider. Salt Water Gargle This solution will help make your mouth and throat feel better. HOME CARE INSTRUCTIONS   Mix 1 teaspoon of salt in 8 ounces of warm water.  Gargle with this solution as much or often as you need or as directed. Swish and gargle gently if you have any sores or wounds in your mouth.  Do not swallow this mixture. Document Released: 09/23/2003 Document Revised: 03/13/2011 Document Reviewed: 02/14/2008 [facility] Patient Information 2015 [place], [place]. This information is not intended to replace advice given to you by your health care provider. Make sure you discuss any questions you have with your health care provider.  Cold Sore A cold sore (fever blister) is a skin infection caused by the herpes simplex virus (HSV-1). HSV-1 is closely related to the virus that causes genital herpes (HSV-2), but they are not the same even though both viruses can cause oral and genital infections. Cold sores are small, fluid-filled sores inside of the mouth or on the lips, gums, nose, chin, cheeks, or fingers.    The herpes simplex virus can be easily passed (contagious) to other people through close personal contact, such as kissing or sharing personal items. The virus can also spread to other parts of the body, such as the eyes or genitals. Cold sores are contagious until the sores crust over completely. They often heal within 2 weeks.  Once a person is infected, the herpes simplex virus remains permanently in the body. Therefore, there is no cure for cold sores, and they often recur when a person is tired, stressed, sick, or gets too much sun. Additional factors that can cause a recurrence include hormone changes in menstruation or pregnancy, certain drugs, and cold weather.  CAUSES  Cold sores are caused by the herpes simplex virus. The virus is spread from person to person through close contact, such as through kissing, touching the affected area, or sharing personal items such as lip balm, razors, or eating utensils.  SYMPTOMS  The first infection may not cause symptoms. If symptoms develop, the symptoms often go through different stages. Here is how a cold sore develops:   Tingling, itching, or burning is felt 1-2 days before the outbreak.   Fluid-filled blisters appear on the lips, inside the mouth, nose, or on the cheeks.   The blisters start to ooze clear fluid.   The blisters dry up and a yellow crust appears in its place.   The crust falls off.  Symptoms depend on whether it is the initial outbreak or a recurrence. Some other symptoms with the first outbreak may include:   Fever.   Sore throat.   Headache.   Muscle aches.   Swollen neck glands.  DIAGNOSIS  A diagnosis is often made based on your symptoms and looking at the sores. Sometimes, a sore may be swabbed and then examined in the lab to make a final diagnosis. If the sores are not present, blood tests can find the herpes simplex virus.  TREATMENT  There is no cure for cold sores and no vaccine for the herpes  simplex virus. Within 2 weeks, most cold sores go away on their own without treatment. Medicines cannot make the infection go away, but medicine can help relieve some of the pain associated with the sores, can work to stop the virus from multiplying, and can also shorten healing time. Medicine may be in the form of creams, gels, pills, or a shot.  HOME CARE INSTRUCTIONS   Only take over-the-counter or prescription medicines for pain, discomfort, or fever as directed by your caregiver. Do not use aspirin.   Use a cotton-tip swab to apply creams or gels to your sores.   Do not touch the sores or pick the scabs. Wash your hands often. Do not touch your eyes without washing your hands first.   Avoid kissing, oral sex, and sharing personal items until sores heal.   Apply an ice pack on your sores for 10-15 minutes to ease any discomfort.   Avoid hot, cold, or salty foods because they may hurt your mouth. Eat a soft, bland diet to avoid irritating the sores. Use a straw  to drink if you have pain when drinking out of a glass.   Keep sores clean and dry to prevent an infection of other tissues.   Avoid the sun and limit stress if these things trigger outbreaks. If sun causes cold sores, apply sunscreen on the lips before being out in the sun.  SEEK MEDICAL CARE IF:   You have a fever or persistent symptoms for more than 2-3 days.   You have a fever and your symptoms suddenly get worse.   You have pus, not clear fluid, coming from the sores.   You have redness that is spreading.   You have pain or irritation in your eye.   You get sores on your genitals.   Your sores do not heal within 2 weeks.   You have a weakened immune system.   You have frequent recurrences of cold sores.  MAKE SURE YOU:   Understand these instructions.  Will watch your condition.  Will get help right away if you are not doing well or get worse. Document Released: 12/17/1999 Document Revised:  05/05/2013 Document Reviewed: 05/03/2011 [facility] Patient Information 2015 [place], [place]. This information is not intended to replace advice given to you by your health care provider. Make sure you discuss any questions you have with your health care provider.

## 2014-01-28 NOTE — ED Provider Notes (Signed)
CSN: [id number]     Arrival date & time 01/28/14  2035 History   First MD Initiated Contact with Patient 01/28/14 2058     Chief Complaint  Patient presents with  . Throat nodule    . Sore Throat  . Sinus Problem   Patient is a 39 y.o. female presenting with pharyngitis and sinus complaint. The history is provided by the patient. No language interpreter was used.  Sore Throat  Sinus Problem  This chart was scribed for non-physician practitioner Antonietta Breach, PA-C,  working with Ephraim Hamburger, MD, by Thea Alken, ED Scribe. This patient was seen in room WTR7/WTR7 and the patient's care was started at 10:20 PM.  Krista [name] is a 39 y.o. female who presents to the Emergency Department complaining of a tender throat nodule onset 1 day. Pt reports symptoms began with yesterday with a sore throat and throat dryness. Pt reports associated pain with swallowing, sinus congestion, and right otalgia. Pt reports a sore just below nostrils from constantly wiping her nose. Pt has not taken medication. Pt denies fever, chills, ear drainage, drooling during the day. Pt denies medical problems and daily medications.   Past Medical History  Diagnosis Date  . Migraine    History reviewed. No pertinent past surgical history. Family History  Problem Relation Age of Onset  . Cancer Mother   . Diabetes Mother   . Hypertension Mother   . Hypertension Father    History  Substance Use Topics  . Smoking status: Never Smoker   . Smokeless tobacco: Never Used  . Alcohol Use: No   OB History    Gravida Para Term Preterm AB TAB SAB Ectopic Multiple Living            1      Review of Systems  HENT: Positive for congestion, ear pain and sore throat.   All other systems reviewed and are negative.   Allergies  Review of patient's allergies indicates no known allergies.  Home Medications   Prior to Admission medications   Medication Sig Start Date End Date Taking? Authorizing Provider  cephALEXin  (KEFLEX) 500 MG capsule Take 1 capsule (500 mg total) by mouth 4 (four) times daily. Patient not taking: Reported on 01/28/2014 05/18/13   Jarrett Soho Muthersbaugh, PA-C  Docosanol 10 % CREA Apply 1 application topically 5 (five) times daily. Apply to lesions under nose as instructed 01/28/14   Antonietta Breach, PA-C  HYDROcodone-acetaminophen (HYCET) 7.5-325 mg/15 ml solution Take 15 mLs by mouth every 4 (four) hours as needed for moderate pain or severe pain (For sore throat; do not drive or drink alcohol while taking this medicine). 01/28/14   Antonietta Breach, PA-C  naproxen (NAPROSYN) 500 MG tablet Take 1 tablet (500 mg total) by mouth 2 (two) times daily. 01/28/14   Antonietta Breach, PA-C  promethazine (PHENERGAN) 25 MG tablet Take 1 tablet (25 mg total) by mouth every 6 (six) hours as needed for nausea or vomiting. 05/18/13   Jarrett Soho Muthersbaugh, PA-C   BP 133/95 mmHg  Pulse 77  Temp(Src) 97.7 F (36.5 C) (Oral)  Resp 16  SpO2 100%  LMP 01/14/2014 (Approximate)   Physical Exam  Constitutional: She is oriented to person, place, and time. She appears well-developed and well-nourished. No distress.  Nontoxic/nonseptic appearing  HENT:  Head: Normocephalic and atraumatic.  Right Ear: Tympanic membrane, external ear and ear canal normal.  Left Ear: Tympanic membrane, external ear and ear canal normal.  Mouth/Throat: Uvula is midline and  mucous membranes are normal. No oral lesions. No trismus in the jaw. No uvula swelling. Posterior oropharyngeal erythema present. No oropharyngeal exudate or posterior oropharyngeal edema.  Audible nasal congestion. Mild posterior oropharyngeal erythema. No significant tonsillar swelling. No exudates. Patient tolerating secretions without difficulty. No edema noted.  Eyes: Conjunctivae and EOM are normal. Pupils are equal, round, and reactive to light. No scleral icterus.  Neck: Normal range of motion. Neck supple.  Tender firm, mobile node c/w submental lyphadenopathy. No nuchal  rigidity or meningismus  Cardiovascular: Normal rate, regular rhythm and normal heart sounds.   Pulmonary/Chest: Effort normal and breath sounds normal. No respiratory distress. She has no wheezes. She has no rales.  Respirations even and unlabored. Lungs clear.  Musculoskeletal: Normal range of motion.  Lymphadenopathy:    She has cervical adenopathy.  Neurological: She is alert and oriented to person, place, and time. She exhibits normal muscle tone. Coordination normal.  Skin: Skin is warm and dry. Rash noted. She is not diaphoretic. No erythema. No pallor.  Punctate vesicles noted to the philtrum with mildly erythematous base, consistent with cold sores.  Psychiatric: She has a normal mood and affect. Her behavior is normal.  Nursing note and vitals reviewed.   ED Course  Procedures (including critical care time) DIAGNOSTIC STUDIES: Oxygen Saturation is 100% on RA, normal by my interpretation.    COORDINATION OF CARE: 10:20 PM- Pt advised of plan for treatment and pt agrees.  Labs Review Labs Reviewed  RAPID STREP SCREEN  CULTURE, GROUP A STREP   Imaging Review No results found.   EKG Interpretation None       MDM   Final diagnoses:  Viral pharyngitis  URI (upper respiratory infection)  Cold sore    Pt afebrile without tonsillar exudate, negative strep. Presents with mild cervical lymphadenopathy and dysphagia; diagnosis of viral pharyngitis. No abx indicated. Will discharge with symptomatic tx for pain. Pt does not appear dehydrated, but did discuss importance of water rehydration. She is tolerating secretions in the ED without difficulty. Presentation non concerning for PTA or infxn spread to soft tissue. No trismus or uvula deviation. Physical exam also significant for evidence of cold sore to the philtrum. Will tx with Abreva as outpatient. No oral lesions noted. Specific return precautions discussed. Recommended PCP follow up. Patient agreeable to plan with no  unaddressed concerns.  I personally performed the services described in this documentation, which was scribed in my presence. The recorded information has been reviewed and is accurate.   Filed Vitals:   01/28/14 2042  BP: 133/95  Pulse: 77  Temp: 97.7 F (36.5 C)  TempSrc: Oral  Resp: 16  SpO2: 100%     Antonietta Breach, PA-C 01/28/14 2226  Ephraim Hamburger, MD 01/29/14 [phone]

## 2014-01-28 NOTE — ED Notes (Signed)
Pt c/o throat nodule appearing (directly underneath chin) this morning. Has had sinus congestion x3 weeks. Sore throat starting yesterday. Denies N/V/D/chills/Fever. Has not taken any OTC medications. Having sensitivity to cold liquids.  No other questions/concerns. Hx CA in family. RR even/unlabored.

## 2014-01-30 LAB — CULTURE, GROUP A STREP

## 2014-10-09 ENCOUNTER — Emergency Department (HOSPITAL_COMMUNITY)
Admission: EM | Admit: 2014-10-09 | Discharge: 2014-10-09 | Disposition: A | Discharge status: Home / Self Care | Payer: Medicaid Other | Attending: Physician Assistant | Admitting: Physician Assistant

## 2014-10-09 ENCOUNTER — Encounter (HOSPITAL_COMMUNITY): Payer: Self-pay | Admitting: Emergency Medicine

## 2014-10-09 DIAGNOSIS — Z791 Long term (current) use of non-steroidal anti-inflammatories (NSAID): Secondary | ICD-10-CM | POA: Diagnosis not present

## 2014-10-09 DIAGNOSIS — Z3202 Encounter for pregnancy test, result negative: Secondary | ICD-10-CM | POA: Diagnosis not present

## 2014-10-09 DIAGNOSIS — G43809 Other migraine, not intractable, without status migrainosus: Secondary | ICD-10-CM | POA: Diagnosis not present

## 2014-10-09 DIAGNOSIS — R51 Headache: Secondary | ICD-10-CM | POA: Diagnosis present

## 2014-10-09 LAB — COMPREHENSIVE METABOLIC PANEL
ALK PHOS: 46 U/L (ref 38–126)
ALT: 12 U/L — ABNORMAL LOW (ref 14–54)
AST: 18 U/L (ref 15–41)
Albumin: 4.1 g/dL (ref 3.5–5.0)
Anion gap: 7 (ref 5–15)
BUN: 9 mg/dL (ref 6–20)
CHLORIDE: 106 mmol/L (ref 101–111)
CO2: 24 mmol/L (ref 22–32)
CREATININE: 0.71 mg/dL (ref 0.44–1.00)
Calcium: 9 mg/dL (ref 8.9–10.3)
GFR calc non Af Amer: >60 mL/min (ref >60)
GLUCOSE: 89 mg/dL (ref 65–99)
Potassium: 3.6 mmol/L (ref 3.5–5.1)
SODIUM: 137 mmol/L (ref 135–145)
Total Bilirubin: 0.6 mg/dL (ref 0.3–1.2)
Total Protein: 7.7 g/dL (ref 6.5–8.1)

## 2014-10-09 LAB — CBC WITH DIFFERENTIAL/PLATELET
BASOS PCT: 1 %
Basophils Absolute: 0 10*3/uL (ref 0.0–0.1)
EOS PCT: 2 %
Eosinophils Absolute: 0.1 10*3/uL (ref 0.0–0.7)
HCT: 32.8 % — ABNORMAL LOW (ref 36.0–46.0)
Hemoglobin: 10.2 g/dL — ABNORMAL LOW (ref 12.0–15.0)
LYMPHS ABS: 1.5 10*3/uL (ref 0.7–4.0)
Lymphocytes Relative: 35 %
MCH: 25.5 pg — AB (ref 26.0–34.0)
MCHC: 31.1 g/dL (ref 30.0–36.0)
MCV: 82 fL (ref 78.0–100.0)
Monocytes Absolute: 0.6 10*3/uL (ref 0.1–1.0)
Monocytes Relative: 13 %
Neutro Abs: 2.2 10*3/uL (ref 1.7–7.7)
Neutrophils Relative %: 49 %
PLATELETS: 300 10*3/uL (ref 150–400)
RBC: 4 MIL/uL (ref 3.87–5.11)
RDW: 15.3 % (ref 11.5–15.5)
WBC: 4.4 10*3/uL (ref 4.0–10.5)

## 2014-10-09 LAB — I-STAT BETA HCG BLOOD, ED (MC, WL, AP ONLY): I-stat hCG, quantitative: <5 m[IU]/mL (ref <5)

## 2014-10-09 MED ORDER — DIPHENHYDRAMINE HCL 50 MG/ML IJ SOLN
25.0000 mg | Freq: Once | INTRAMUSCULAR | Status: AC
  Administered 2014-10-09: 25 mg via INTRAVENOUS
  Filled 2014-10-09: qty 1

## 2014-10-09 MED ORDER — SODIUM CHLORIDE 0.9 % IV BOLUS (SEPSIS)
1000.0000 mL | Freq: Once | INTRAVENOUS | Status: AC
  Administered 2014-10-09: 1000 mL via INTRAVENOUS

## 2014-10-09 MED ORDER — PROCHLORPERAZINE EDISYLATE 5 MG/ML IJ SOLN
10.0000 mg | Freq: Once | INTRAMUSCULAR | Status: AC
  Administered 2014-10-09: 10 mg via INTRAVENOUS
  Filled 2014-10-09: qty 2

## 2014-10-09 NOTE — Discharge Instructions (Signed)
Migraine Headache  A migraine headache is very bad, throbbing pain on one or both sides of your head. Talk to your doctor about what things may bring on (trigger) your migraine headaches.  HOME CARE  · Only take medicines as told by your doctor.  · Lie down in a dark, quiet room when you have a migraine.  · Keep a journal to find out if certain things bring on migraine headaches. For example, write down:    What you eat and drink.    How much sleep you get.    Any change to your diet or medicines.  · Lessen how much alcohol you drink.  · Quit smoking if you smoke.  · Get enough sleep.  · Lessen any stress in your life.  · Keep lights dim if bright lights bother you or make your migraines worse.  GET HELP RIGHT AWAY IF:   · Your migraine becomes really bad.  · You have a fever.  · You have a stiff neck.  · You have trouble seeing.  · Your muscles are weak, or you lose muscle control.  · You lose your balance or have trouble walking.  · You feel like you will pass out (faint), or you pass out.  · You have really bad symptoms that are different than your first symptoms.  MAKE SURE YOU:   · Understand these instructions.  · Will watch your condition.  · Will get help right away if you are not doing well or get worse.     This information is not intended to replace advice given to you by your health care provider. Make sure you discuss any questions you have with your health care provider.     Document Released: 09/28/2007 Document Revised: 03/13/2011 Document Reviewed: 08/26/2012  Elsevier Interactive Patient Education ©2016 [employer]

## 2014-10-09 NOTE — ED Provider Notes (Signed)
CSN: [id number]     Arrival date & time 10/09/14  1146 History   First MD Initiated Contact with Patient 10/09/14 1354     Chief Complaint  Patient presents with  . Migraine     (Consider location/radiation/quality/duration/timing/severity/associated sxs/prior Treatment) HPI   Patient is a 39 year old female presented with migraine. Patient's had multiple migraines in the past. This feels like her normal migraine. She's had this for the last 2 weeks. She is sensitive to light. Along with mild nausea.    Past Medical History  Diagnosis Date  . Migraine    History reviewed. No pertinent past surgical history. Family History  Problem Relation Age of Onset  . Cancer Mother   . Diabetes Mother   . Hypertension Mother   . Hypertension Father    Social History  Substance Use Topics  . Smoking status: Never Smoker   . Smokeless tobacco: Never Used  . Alcohol Use: No   OB History    Gravida Para Term Preterm AB TAB SAB Ectopic Multiple Living            1     Review of Systems  Constitutional: Negative for activity change and fatigue.  HENT: Negative for congestion and drooling.   Eyes: Negative for discharge.  Respiratory: Negative for cough and chest tightness.   Cardiovascular: Negative for chest pain.  Gastrointestinal: Negative for abdominal distention.  Genitourinary: Negative for dysuria and difficulty urinating.  Musculoskeletal: Negative for joint swelling.  Skin: Negative for rash.  Allergic/Immunologic: Negative for immunocompromised state.  Neurological: Positive for headaches. Negative for seizures, speech difficulty and weakness.  Psychiatric/Behavioral: Negative for behavioral problems and agitation.      Allergies  Review of patient's allergies indicates no known allergies.  Home Medications   Prior to Admission medications   Medication Sig Start Date End Date Taking? Authorizing Provider  cephALEXin (KEFLEX) 500 MG capsule Take 1 capsule (500 mg  total) by mouth 4 (four) times daily. Patient not taking: Reported on 01/28/2014 05/18/13   Jarrett Soho Muthersbaugh, PA-C  Docosanol 10 % CREA Apply 1 application topically 5 (five) times daily. Apply to lesions under nose as instructed 01/28/14   Antonietta Breach, PA-C  HYDROcodone-acetaminophen (HYCET) 7.5-325 mg/15 ml solution Take 15 mLs by mouth every 4 (four) hours as needed for moderate pain or severe pain (For sore throat; do not drive or drink alcohol while taking this medicine). 01/28/14   Antonietta Breach, PA-C  naproxen (NAPROSYN) 500 MG tablet Take 1 tablet (500 mg total) by mouth 2 (two) times daily. 01/28/14   Antonietta Breach, PA-C  promethazine (PHENERGAN) 25 MG tablet Take 1 tablet (25 mg total) by mouth every 6 (six) hours as needed for nausea or vomiting. 05/18/13   Jarrett Soho Muthersbaugh, PA-C   BP 139/100 mmHg  Pulse 80  Temp(Src) 98.1 F (36.7 C) (Oral)  Resp 18  SpO2 100%  LMP 10/07/2014 Physical Exam  Constitutional: She is oriented to person, place, and time. She appears well-developed and well-nourished.  HENT:  Head: Normocephalic and atraumatic.  Eyes: Conjunctivae are normal. Right eye exhibits no discharge.  Neck: Neck supple.  Cardiovascular: Normal rate, regular rhythm and normal heart sounds.   No murmur heard. Pulmonary/Chest: Effort normal and breath sounds normal. She has no wheezes. She has no rales.  Abdominal: Soft. She exhibits no distension. There is no tenderness.  Musculoskeletal: Normal range of motion. She exhibits no edema.  Neurological: She is alert and oriented to person, place, and time. No  cranial nerve deficit.  Skin: Skin is warm and dry. No rash noted. She is not diaphoretic.  Psychiatric: She has a normal mood and affect. Her behavior is normal.  Nursing note and vitals reviewed.   ED Course  Procedures (including critical care time) Labs Review Labs Reviewed  CBC WITH DIFFERENTIAL/PLATELET  COMPREHENSIVE METABOLIC PANEL  HCG, SERUM, QUALITATIVE     Imaging Review No results found. I have personally reviewed and evaluated these images and lab results as part of my medical decision-making.   EKG Interpretation None      MDM   Final diagnoses:  None   patient is otherwise healthy 39 year old female presenting with migraine. Patient has a long history of migraines. She states this has been going on for last 2 weeks. We will give her a migraine cocktail here. She says this feels like her usual migraines however her mom was worried and so her mom asked for whether she could get MRI. We will give her a consult to neurology. Neurology will  Determine as an outpatient  whether an outpatient MRI would be appropriate.  There is nothing concerning about her headaches today, no fevers, no headaches worse in the morning, no neurologic changes.  Aeriana Speece Julio Alm, MD 10/09/14 1445

## 2014-10-09 NOTE — ED Notes (Signed)
Pt reports migraine x 3 weeks and is sensitive to "everything". Pt has hx of migraine and is not c/o of any other symptoms.

## 2014-10-09 NOTE — ED Notes (Signed)
Nurse drawing labs. 

## 2015-08-02 ENCOUNTER — Encounter (HOSPITAL_COMMUNITY): Payer: Self-pay | Admitting: Emergency Medicine

## 2015-08-02 ENCOUNTER — Emergency Department (HOSPITAL_COMMUNITY)
Admission: EM | Admit: 2015-08-02 | Discharge: 2015-08-02 | Disposition: A | Discharge status: Home / Self Care | Payer: Medicaid Other | Attending: Emergency Medicine | Admitting: Emergency Medicine

## 2015-08-02 DIAGNOSIS — N939 Abnormal uterine and vaginal bleeding, unspecified: Secondary | ICD-10-CM | POA: Diagnosis not present

## 2015-08-02 DIAGNOSIS — N938 Other specified abnormal uterine and vaginal bleeding: Secondary | ICD-10-CM

## 2015-08-02 DIAGNOSIS — Z7982 Long term (current) use of aspirin: Secondary | ICD-10-CM | POA: Insufficient documentation

## 2015-08-02 LAB — WET PREP, GENITAL
Clue Cells Wet Prep HPF POC: NONE SEEN
Sperm: NONE SEEN
TRICH WET PREP: NONE SEEN
Yeast Wet Prep HPF POC: NONE SEEN

## 2015-08-02 LAB — CBC
HCT: 32.6 % — ABNORMAL LOW (ref 36.0–46.0)
Hemoglobin: 10.2 g/dL — ABNORMAL LOW (ref 12.0–15.0)
MCH: 25.7 pg — ABNORMAL LOW (ref 26.0–34.0)
MCHC: 31.3 g/dL (ref 30.0–36.0)
MCV: 82.1 fL (ref 78.0–100.0)
Platelets: 298 10*3/uL (ref 150–400)
RBC: 3.97 MIL/uL (ref 3.87–5.11)
RDW: 15.6 % — AB (ref 11.5–15.5)
WBC: 3.6 10*3/uL — ABNORMAL LOW (ref 4.0–10.5)

## 2015-08-02 LAB — I-STAT BETA HCG BLOOD, ED (MC, WL, AP ONLY): I-stat hCG, quantitative: <5 m[IU]/mL (ref <5)

## 2015-08-02 MED ORDER — MEDROXYPROGESTERONE ACETATE 10 MG PO TABS: 10.0000 mg | ORAL_TABLET | Freq: Every day | ORAL | 0 refills | Status: DC

## 2015-08-02 NOTE — ED Provider Notes (Signed)
[facility] DEPT Provider Note   CSN: [id number] Arrival date & time: 08/02/15  1042  First Provider Contact:  First MD Initiated Contact with Patient 08/02/15 1354        History   Chief Complaint Chief Complaint  Patient presents with  . Vaginal Bleeding  . Migraine    HPI Krista [name] is a 40 y.o. female.  Patient presents to the emergency department with complaints of ongoing vaginal bleeding over the past 29 days.  This is abnormal for the patient as she normally has 28 day cycles.  Denies abdominal pain.  Denies nausea vomiting.  She is not been seen or evaluated by a gynecologist.  She is not on anticoagulants.  Symptoms are mild in severity.  No weakness shortness of breath or lightheadedness.   The history is provided by the patient.    Past Medical History:  Diagnosis Date  . Migraine     There are no active problems to display for this patient.   History reviewed. No pertinent surgical history.  OB History    Gravida Para Term Preterm AB Living             1   SAB TAB Ectopic Multiple Live Births                   Home Medications    Prior to Admission medications   Medication Sig Start Date End Date Taking? Authorizing Provider  Aspirin-Salicylamide-Caffeine (BC HEADACHE POWDER PO) Take 1 each by mouth every 6 (six) hours as needed (pain).   Yes Historical Provider, MD           Family History Family History  Problem Relation Age of Onset  . Cancer Mother   . Diabetes Mother   . Hypertension Mother   . Hypertension Father     Social History Social History  Substance Use Topics  . Smoking status: Never Smoker  . Smokeless tobacco: Never Used  . Alcohol use No     Allergies   Review of patient's allergies indicates no known allergies.   Review of Systems Review of Systems  All other systems reviewed and are negative.    Physical Exam Updated Vital Signs BP (!) 152/101 (BP Location: Left Arm)   Pulse (!) 51   Temp 98.4  F (36.9 C) (Oral)   Resp 17   LMP 07/04/2015   SpO2 100%   Physical Exam  Constitutional: She is oriented to person, place, and time. She appears well-developed and well-nourished.  HENT:  Head: Normocephalic.  Eyes: EOM are normal.  Neck: Normal range of motion.  Cardiovascular: Normal rate.   Pulmonary/Chest: Effort normal.  Abdominal: She exhibits no distension. There is no tenderness.  Genitourinary:  Genitourinary Comments: Small amount of bleeding from the cervical os.  Os is closed.  No other vaginal abnormalities noted  Musculoskeletal: Normal range of motion.  Neurological: She is alert and oriented to person, place, and time.  Psychiatric: She has a normal mood and affect.  Nursing note and vitals reviewed.    ED Treatments / Results  Labs (all labs ordered are listed, but only abnormal results are displayed) Labs Reviewed  WET PREP, GENITAL - Abnormal; Notable for the following:       Result Value   WBC, Wet Prep HPF POC MANY (*)    All other components within normal limits  CBC - Abnormal; Notable for the following:    WBC 3.6 (*)    Hemoglobin 10.2 (*)  HCT 32.6 (*)    MCH 25.7 (*)    RDW 15.6 (*)    All other components within normal limits  I-STAT BETA HCG BLOOD, ED ([facility], [facility], [facility] ONLY)  GC/CHLAMYDIA PROBE AMP ([place]) NOT AT [facility]   Hemoglobin  Date Value Ref Range Status  08/02/2015 10.2 (L) 12.0 - 15.0 g/dL Final  10/09/2014 10.2 (L) 12.0 - 15.0 g/dL Final  08/14/2012 10.2 (L) 12.0 - 15.0 g/dL Final     EKG  EKG Interpretation None       Radiology No results found.  Procedures Procedures (including critical care time)  Medications Ordered in ED Medications - No data to display   Initial Impression / Assessment and Plan / ED Course  I have reviewed the triage vital signs and the nursing notes.  Pertinent labs & imaging results that were available during my care of the patient were reviewed by me and considered in my medical  decision making (see chart for details).  Clinical Course    Dysfunctional uterine bleeding.  Pregnancy negative.  Patient be placed on a ten-day course of Provera.  Gynecologic follow-up.  Patient understands return to the ER for new or worsening symptoms  Final Clinical Impressions(s) / ED Diagnoses   Final diagnoses:  DUB (dysfunctional uterine bleeding)    New Prescriptions New Prescriptions   MEDROXYPROGESTERONE (PROVERA) 10 MG TABLET    Take 1 tablet (10 mg total) by mouth daily.     Jola Schmidt, MD 08/02/15 [phone]

## 2015-08-02 NOTE — ED Triage Notes (Signed)
Pt reports constant right headache since June and heavy vaginal bleeding since July 2. Pt states has not been evaluated for either complaint.

## 2015-08-03 LAB — GC/CHLAMYDIA PROBE AMP (~~LOC~~) NOT AT ARMC
CHLAMYDIA, DNA PROBE: NEGATIVE
Neisseria Gonorrhea: NEGATIVE

## 2015-10-21 ENCOUNTER — Encounter (HOSPITAL_COMMUNITY): Payer: Self-pay

## 2015-10-21 ENCOUNTER — Emergency Department (HOSPITAL_COMMUNITY)
Admission: EM | Admit: 2015-10-21 | Discharge: 2015-10-22 | Disposition: A | Discharge status: Home / Self Care | Payer: Medicaid Other | Attending: Emergency Medicine | Admitting: Emergency Medicine

## 2015-10-21 DIAGNOSIS — Z7982 Long term (current) use of aspirin: Secondary | ICD-10-CM | POA: Diagnosis not present

## 2015-10-21 DIAGNOSIS — R519 Headache, unspecified: Secondary | ICD-10-CM

## 2015-10-21 DIAGNOSIS — I10 Essential (primary) hypertension: Secondary | ICD-10-CM | POA: Diagnosis not present

## 2015-10-21 DIAGNOSIS — R51 Headache: Secondary | ICD-10-CM | POA: Insufficient documentation

## 2015-10-21 LAB — CBG MONITORING, ED: GLUCOSE-CAPILLARY: 65 mg/dL (ref 65–99)

## 2015-10-21 LAB — BASIC METABOLIC PANEL
Anion gap: 3 — ABNORMAL LOW (ref 5–15)
BUN: 10 mg/dL (ref 6–20)
CALCIUM: 9.1 mg/dL (ref 8.9–10.3)
CO2: 26 mmol/L (ref 22–32)
CREATININE: 0.73 mg/dL (ref 0.44–1.00)
Chloride: 108 mmol/L (ref 101–111)
GFR calc Af Amer: >60 mL/min (ref >60)
GLUCOSE: 70 mg/dL (ref 65–99)
Potassium: 3.4 mmol/L — ABNORMAL LOW (ref 3.5–5.1)
Sodium: 137 mmol/L (ref 135–145)

## 2015-10-21 LAB — CBC
HCT: 30.6 % — ABNORMAL LOW (ref 36.0–46.0)
Hemoglobin: 9.6 g/dL — ABNORMAL LOW (ref 12.0–15.0)
MCH: 24.7 pg — AB (ref 26.0–34.0)
MCHC: 31.4 g/dL (ref 30.0–36.0)
MCV: 78.9 fL (ref 78.0–100.0)
Platelets: 336 10*3/uL (ref 150–400)
RBC: 3.88 MIL/uL (ref 3.87–5.11)
RDW: 15.4 % (ref 11.5–15.5)
WBC: 6 10*3/uL (ref 4.0–10.5)

## 2015-10-21 NOTE — ED Triage Notes (Signed)
Pt states that she has been experiencing a headache with intermittent R sided numbness since yesterday afternoon around 3pm. Pt also states that she passed out at school yesterday. Ambulatory. A&Ox4. No facial droop noted.

## 2015-10-21 NOTE — ED Notes (Signed)
Pt states her blood pressure is normally 140/100. Pt states she has never been told to take anything for bp. Pt states her headache has currently subsided, but has decreased sensation on her right hand and right foot

## 2015-10-22 MED ORDER — AMLODIPINE BESYLATE 5 MG PO TABS
10.0000 mg | ORAL_TABLET | Freq: Once | ORAL | Status: AC
  Administered 2015-10-22: 10 mg via ORAL
  Filled 2015-10-22: qty 2

## 2015-10-22 MED ORDER — KETOROLAC TROMETHAMINE 30 MG/ML IJ SOLN
30.0000 mg | Freq: Once | INTRAMUSCULAR | Status: AC
  Administered 2015-10-22: 30 mg via INTRAVENOUS
  Filled 2015-10-22: qty 1

## 2015-10-22 MED ORDER — METOCLOPRAMIDE HCL 5 MG/ML IJ SOLN
10.0000 mg | Freq: Once | INTRAMUSCULAR | Status: AC
  Administered 2015-10-22: 10 mg via INTRAVENOUS
  Filled 2015-10-22: qty 2

## 2015-10-22 MED ORDER — DIPHENHYDRAMINE HCL 50 MG/ML IJ SOLN
50.0000 mg | Freq: Once | INTRAMUSCULAR | Status: AC
  Administered 2015-10-22: 50 mg via INTRAVENOUS
  Filled 2015-10-22: qty 1

## 2015-10-22 MED ORDER — AMLODIPINE BESYLATE 10 MG PO TABS: 10.0000 mg | ORAL_TABLET | Freq: Every day | ORAL | 0 refills | Status: DC

## 2015-10-22 NOTE — ED Provider Notes (Signed)
[facility] DEPT Provider Note   CSN: [id number] Arrival date & time: 10/21/15  2131  By signing my name below, I, Gwenlyn Fudge, attest that this documentation has been prepared under the direction and in the presence of Everlene Balls, MD. Electronically Signed: Gwenlyn Fudge, ED Scribe. 10/22/15. 12:34 AM.   History   Chief Complaint Chief Complaint  Patient presents with  . Headache  . Numbness    R side   The history is provided by the patient. No language interpreter was used.    HPI Comments: Krista [name] is a 40 y.o. female who presents to the Emergency Department complaining of gradual onset, constant headache onset yesterday. Pt reports associated right sided numbness, upper and lower ext.  This has no improved without any intervention.   Reports blood pressure has been fluctuating, but now she is constantly hypertensive. Pt measured blood pressure earlier today 160/110 Pt has hx of migraines, but denies feeling right sided numbness before. Has not tried Tylenol or Ibuprofen for relief. She had a fever last week, but notes that it has self resolved. She does not currently take nay medication for blood pressure. Denies cough, vomiting, diarrhea, dysuria, hematuria.  Past Medical History:  Diagnosis Date  . Migraine     There are no active problems to display for this patient.   History reviewed. No pertinent surgical history.  OB History    Gravida Para Term Preterm AB Living             1   SAB TAB Ectopic Multiple Live Births                   Home Medications    Prior to Admission medications   Medication Sig Start Date End Date Taking? Authorizing Provider  Aspirin-Salicylamide-Caffeine (BC HEADACHE POWDER PO) Take 1 each by mouth every 6 (six) hours as needed (pain).   Yes Historical Provider, MD  medroxyPROGESTERone (PROVERA) 10 MG tablet Take 1 tablet (10 mg total) by mouth daily. Patient not taking: Reported on 10/22/2015 08/02/15   Jola Schmidt, MD     Family History Family History  Problem Relation Age of Onset  . Cancer Mother   . Diabetes Mother   . Hypertension Mother   . Hypertension Father     Social History Social History  Substance Use Topics  . Smoking status: Never Smoker  . Smokeless tobacco: Never Used  . Alcohol use No     Allergies   Review of patient's allergies indicates no known allergies.   Review of Systems Review of Systems 10 Systems reviewed and are negative for acute change except as noted in the HPI.  Physical Exam Updated Vital Signs BP 162/98 (BP Location: Right Arm)   Pulse (!) 52   Temp 98.8 F (37.1 C) (Oral)   Resp 18   Wt 160 lb (72.6 kg)   SpO2 98%   BMI 23.63 kg/m   Physical Exam  Constitutional: She is oriented to person, place, and time. She appears well-developed and well-nourished. No distress.  HENT:  Head: Normocephalic and atraumatic.  Nose: Nose normal.  Mouth/Throat: Oropharynx is clear and moist. No oropharyngeal exudate.  Eyes: Conjunctivae and EOM are normal. Pupils are equal, round, and reactive to light. No scleral icterus.  Neck: Normal range of motion. Neck supple. No JVD present. No tracheal deviation present. No thyromegaly present.  Cardiovascular: Normal rate, regular rhythm and normal heart sounds.  Exam reveals no gallop and no friction rub.  No murmur heard. Pulmonary/Chest: Effort normal and breath sounds normal. No respiratory distress. She has no wheezes. She exhibits no tenderness.  Abdominal: Soft. Bowel sounds are normal. She exhibits no distension and no mass. There is no tenderness. There is no rebound and no guarding.  Musculoskeletal: Normal range of motion. She exhibits no edema or tenderness.  Lymphadenopathy:    She has no cervical adenopathy.  Neurological: She is alert and oriented to person, place, and time. No cranial nerve deficit. She exhibits normal muscle tone.  normal strength and sensation in all extremities  Normal  cerebellar testing   Skin: Skin is warm and dry. No rash noted. No erythema. No pallor.  Nursing note and vitals reviewed.   ED Treatments / Results  DIAGNOSTIC STUDIES: Oxygen Saturation is 98% on RA, normal by my interpretation.    COORDINATION OF CARE: 12:32 AM Discussed treatment plan with pt at bedside which includes urinalysis and benadryl and pt agreed to plan.  Labs (all labs ordered are listed, but only abnormal results are displayed) Labs Reviewed  BASIC METABOLIC PANEL - Abnormal; Notable for the following:       Result Value   Potassium 3.4 (*)    Anion gap 3 (*)    All other components within normal limits  CBC - Abnormal; Notable for the following:    Hemoglobin 9.6 (*)    HCT 30.6 (*)    MCH 24.7 (*)    All other components within normal limits  URINALYSIS, ROUTINE W REFLEX MICROSCOPIC (NOT AT [facility])  CBG MONITORING, ED  POC URINE PREG, ED    EKG  EKG Interpretation None       Radiology No results found.  Procedures Procedures (including critical care time)  Medications Ordered in ED Medications - No data to display   Initial Impression / Assessment and Plan / ED Course  I have reviewed the triage vital signs and the nursing notes.  Pertinent labs & imaging results that were available during my care of the patient were reviewed by me and considered in my medical decision making (see chart for details).  Clinical Course    Patient presents to the ED for migraine.; It appears to be a complex migraine causing her numbness as well.  Headache was gradual in onset.  Neuro exam is completely normal. He denies any recent fevers or infections.  Her symptoms are already improving before I came into the room. Will treat headache with toradol, reglan, and benadryl.    1:44 AM Upon repeat evaluation, patient states her headache has significantly improved.  Will DC with amlodipine to take daily and PCP fu.  She appears well and in NAD.  Neuro exam remains  normal.  VS remain within her normal limits and she is safe for DC.  I personally performed the services described in this documentation, which was scribed in my presence. The recorded information has been reviewed and is accurate.     Final Clinical Impressions(s) / ED Diagnoses   Final diagnoses:  None    New Prescriptions New Prescriptions   No medications on file     Everlene Balls, MD 10/22/15 0145

## 2016-09-26 NOTE — ED Notes (Signed)
Pt being sent by Urgent Care d/t abdominal pain x 2 days.

## 2016-09-28 ENCOUNTER — Encounter (HOSPITAL_COMMUNITY): Payer: Self-pay | Admitting: Emergency Medicine

## 2016-09-28 ENCOUNTER — Emergency Department (HOSPITAL_COMMUNITY): Payer: Medicaid Other

## 2016-09-28 ENCOUNTER — Emergency Department (HOSPITAL_COMMUNITY)
Admission: EM | Admit: 2016-09-28 | Discharge: 2016-09-28 | Disposition: A | Discharge status: Home / Self Care | Payer: Medicaid Other | Attending: Emergency Medicine | Admitting: Emergency Medicine

## 2016-09-28 DIAGNOSIS — R1011 Right upper quadrant pain: Secondary | ICD-10-CM | POA: Insufficient documentation

## 2016-09-28 LAB — CBC
HEMATOCRIT: 33.4 % — AB (ref 36.0–46.0)
HEMOGLOBIN: 10.5 g/dL — AB (ref 12.0–15.0)
MCH: 25.7 pg — ABNORMAL LOW (ref 26.0–34.0)
MCHC: 31.4 g/dL (ref 30.0–36.0)
MCV: 81.7 fL (ref 78.0–100.0)
Platelets: 305 10*3/uL (ref 150–400)
RBC: 4.09 MIL/uL (ref 3.87–5.11)
RDW: 15 % (ref 11.5–15.5)
WBC: 3.4 10*3/uL — AB (ref 4.0–10.5)

## 2016-09-28 LAB — COMPREHENSIVE METABOLIC PANEL
ALBUMIN: 4 g/dL (ref 3.5–5.0)
ALT: 10 U/L — ABNORMAL LOW (ref 14–54)
ANION GAP: 7 (ref 5–15)
AST: 16 U/L (ref 15–41)
Alkaline Phosphatase: 49 U/L (ref 38–126)
BILIRUBIN TOTAL: 0.3 mg/dL (ref 0.3–1.2)
BUN: 9 mg/dL (ref 6–20)
CO2: 26 mmol/L (ref 22–32)
Calcium: 9.1 mg/dL (ref 8.9–10.3)
Chloride: 108 mmol/L (ref 101–111)
Creatinine, Ser: 0.82 mg/dL (ref 0.44–1.00)
Glucose, Bld: 83 mg/dL (ref 65–99)
POTASSIUM: 3.7 mmol/L (ref 3.5–5.1)
Sodium: 141 mmol/L (ref 135–145)
TOTAL PROTEIN: 7.4 g/dL (ref 6.5–8.1)

## 2016-09-28 LAB — I-STAT BETA HCG BLOOD, ED (MC, WL, AP ONLY)

## 2016-09-28 LAB — LIPASE, BLOOD: Lipase: 16 U/L (ref 11–51)

## 2016-09-28 MED ORDER — IOPAMIDOL (ISOVUE-300) INJECTION 61%
100.0000 mL | Freq: Once | INTRAVENOUS | Status: AC | PRN
  Administered 2016-09-28: 100 mL via INTRAVENOUS

## 2016-09-28 MED ORDER — SODIUM CHLORIDE 0.9 % IV BOLUS (SEPSIS)
500.0000 mL | Freq: Once | INTRAVENOUS | Status: AC
  Administered 2016-09-28: 500 mL via INTRAVENOUS

## 2016-09-28 MED ORDER — PROMETHAZINE HCL 25 MG PO TABS: 25.0000 mg | ORAL_TABLET | Freq: Four times a day (QID) | ORAL | 1 refills | Status: DC | PRN

## 2016-09-28 MED ORDER — SODIUM CHLORIDE 0.9 % IV SOLN: INTRAVENOUS | Status: DC

## 2016-09-28 MED ORDER — ONDANSETRON HCL 4 MG/2ML IJ SOLN
4.0000 mg | Freq: Once | INTRAMUSCULAR | Status: AC
  Administered 2016-09-28: 4 mg via INTRAVENOUS
  Filled 2016-09-28: qty 2

## 2016-09-28 MED ORDER — HYDROMORPHONE HCL 1 MG/ML IJ SOLN
1.0000 mg | Freq: Once | INTRAMUSCULAR | Status: DC
  Filled 2016-09-28: qty 1

## 2016-09-28 MED ORDER — IOPAMIDOL (ISOVUE-300) INJECTION 61%
INTRAVENOUS | Status: AC
  Filled 2016-09-28: qty 100

## 2016-09-28 MED ORDER — HYDROCODONE-ACETAMINOPHEN 5-325 MG PO TABS: 1.0000 | ORAL_TABLET | Freq: Four times a day (QID) | ORAL | 0 refills | Status: DC | PRN

## 2016-09-28 NOTE — Discharge Instructions (Signed)
Workup for the right upper quadrant abdominal pain without any significant findings. There was the finding in the liver which will require follow-up MRI. Copies of these results provided to you. Follow-up with your provider to have the MRI scheduled. In the meantime take hydrocodone as needed for pain. And Phenergan as needed for nausea and vomiting. Return for any new or worse symptoms. Work note provided.

## 2016-09-28 NOTE — ED Provider Notes (Signed)
[facility] DEPT Provider Note   CSN: [id number] Arrival date & time: 09/28/16  4098     History   Chief Complaint Chief Complaint  Patient presents with  . Abdominal Pain  . Emesis    HPI Krista Younker is a 41 y.o. female.  Patient with the complaint of right-sided abdominal pain since Monday. Started in right lower quadrant and then moved more up to the right upper quadrant. Patient's had intermittent nausea and vomiting but no diarrhea my history. Patient states the pain is worse with walking and laying down. Not made worse by moving her leg. Patient has not had pain like this before. Symptoms started around the 2100 on Tuesday. Patient denies any fevers. There is a mild headache associated with it.      Past Medical History:  Diagnosis Date  . Migraine     There are no active problems to display for this patient.   History reviewed. No pertinent surgical history.  OB History    Gravida Para Term Preterm AB Living             1   SAB TAB Ectopic Multiple Live Births                   Home Medications    Prior to Admission medications   Medication Sig Start Date End Date Taking? Authorizing Provider  amLODipine (NORVASC) 10 MG tablet Take 1 tablet (10 mg total) by mouth daily. Patient not taking: Reported on 09/28/2016 10/22/15   Everlene Balls, MD  HYDROcodone-acetaminophen (NORCO/VICODIN) 5-325 MG tablet Take 1-2 tablets by mouth every 6 (six) hours as needed for moderate pain. 09/28/16   Fredia Sorrow, MD  promethazine (PHENERGAN) 25 MG tablet Take 1 tablet (25 mg total) by mouth every 6 (six) hours as needed for nausea or vomiting. 09/28/16   Fredia Sorrow, MD    Family History Family History  Problem Relation Age of Onset  . Cancer Mother   . Diabetes Mother   . Hypertension Mother   . Hypertension Father     Social History Social History  Substance Use Topics  . Smoking status: Never Smoker  . Smokeless tobacco: Never Used  . Alcohol use  No     Allergies   Patient has no known allergies.   Review of Systems Review of Systems  Constitutional: Negative for diaphoresis and fever.  HENT: Negative for congestion.   Eyes: Negative for redness.  Respiratory: Negative for shortness of breath.   Cardiovascular: Negative for chest pain.  Gastrointestinal: Positive for abdominal pain, nausea and vomiting. Negative for diarrhea.  Genitourinary: Negative for dysuria.  Musculoskeletal: Negative for back pain.  Skin: Negative for rash.  Neurological: Negative for headaches.  Hematological: Does not bruise/bleed easily.  Psychiatric/Behavioral: Negative for confusion.     Physical Exam Updated Vital Signs BP (!) 149/105 (BP Location: Right Arm)   Pulse (!) 58   Temp 97.9 F (36.6 C) (Oral)   Resp 16   Ht 1.753 m (5\' 9" )   Wt 68 kg (150 lb)   LMP 09/10/2016 Comment: negative hcg blood test 09-28-2016  SpO2 100%   BMI 22.15 kg/m   Physical Exam  Constitutional: She is oriented to person, place, and time. She appears well-developed and well-nourished. No distress.  HENT:  Head: Normocephalic and atraumatic.  Mucous membranes slightly dry.  Eyes: Pupils are equal, round, and reactive to light. EOM are normal.  Neck: Normal range of motion. Neck supple.  Cardiovascular: Normal  rate, regular rhythm and normal heart sounds.   Abdominal: Soft. Bowel sounds are normal.  Tender right side of the abdomen.  Musculoskeletal: Normal range of motion. She exhibits no edema.  Neurological: She is alert and oriented to person, place, and time. No cranial nerve deficit or sensory deficit. She exhibits normal muscle tone. Coordination normal.  Skin: Skin is warm.  Nursing note and vitals reviewed.    ED Treatments / Results  Labs (all labs ordered are listed, but only abnormal results are displayed) Labs Reviewed  COMPREHENSIVE METABOLIC PANEL - Abnormal; Notable for the following:       Result Value   ALT 10 (*)    All  other components within normal limits  CBC - Abnormal; Notable for the following:    WBC 3.4 (*)    Hemoglobin 10.5 (*)    HCT 33.4 (*)    MCH 25.7 (*)    All other components within normal limits  LIPASE, BLOOD  I-STAT BETA HCG BLOOD, ED ([facility], [facility], [facility] ONLY)    EKG  EKG Interpretation None       Radiology Ct Abdomen Pelvis W Contrast  Result Date: 09/28/2016 CLINICAL DATA:  Right lower quadrant pain since Tuesday. Nausea vomiting. EXAM: CT ABDOMEN AND PELVIS WITH CONTRAST TECHNIQUE: Multidetector CT imaging of the abdomen and pelvis was performed using the standard protocol following bolus administration of intravenous contrast. CONTRAST:  171mL ISOVUE-300 IOPAMIDOL (ISOVUE-300) INJECTION 61% COMPARISON:  None. FINDINGS: Lower chest:  Unremarkable. Hepatobiliary: 2.7 cm non cystic lesion identified in the dome of the liver (image 10 series 2). There is no evidence for gallstones, gallbladder wall thickening, or pericholecystic fluid. No intrahepatic or extrahepatic biliary dilation. Pancreas: No focal mass lesion. No dilatation of the main duct. No intraparenchymal cyst. No peripancreatic edema. Spleen: No splenomegaly. No focal mass lesion. Adrenals/Urinary Tract: No adrenal nodule or mass. Kidneys are unremarkable. No evidence for hydroureter. The urinary bladder appears normal for the degree of distention. Stomach/Bowel: Stomach is nondistended. No gastric wall thickening. No evidence of outlet obstruction. Duodenum is normally positioned as is the ligament of Treitz. No small bowel wall thickening. No small bowel dilatation. The terminal ileum is normal. Air-filled appendix measures 5 mm diameter. No periappendiceal edema or inflammation.No gross colonic mass. No colonic wall thickening. No substantial diverticular change. Vascular/Lymphatic: No abdominal aortic aneurysm. No abdominal aortic atherosclerotic calcification. Upper normal all anterior right juxta diaphragmatic lymph node  identified. There is no gastrohepatic or hepatoduodenal ligament lymphadenopathy. No intraperitoneal or retroperitoneal lymphadenopathy. No pelvic sidewall lymphadenopathy. Reproductive: The uterus has normal CT imaging appearance. There is no adnexal mass. Other: Tiny amount of fluid is identified in the cul-de-sac. Musculoskeletal: Bone windows reveal no worrisome lytic or sclerotic osseous lesions. IMPRESSION: 1. No acute findings in the abdomen or pelvis to explain the patient's history of right lower quadrant pain. 2. 2.7 cm non cystic lesion identified in the hepatic dome. MRI without with contrast recommended to further evaluate. Use of Eovist contrast material recommended. 3. Trace fluid in the cul-de-sac can be physiologic in a premenopausal female. Electronically Signed   By: Misty Stanley M.D.   On: 09/28/2016 13:17    Procedures Procedures (including critical care time)  Medications Ordered in ED Medications  0.9 %  sodium chloride infusion (not administered)  HYDROmorphone (DILAUDID) injection 1 mg (1 mg Intravenous Refused 09/28/16 1233)  sodium chloride 0.9 % bolus 500 mL (500 mLs Intravenous New Bag/Given 09/28/16 1224)  ondansetron (ZOFRAN) injection 4 mg (  4 mg Intravenous Given 09/28/16 1224)  iopamidol (ISOVUE-300) 61 % injection 100 mL (100 mLs Intravenous Contrast Given 09/28/16 1246)     Initial Impression / Assessment and Plan / ED Course  I have reviewed the triage vital signs and the nursing notes.  Pertinent labs & imaging results that were available during my care of the patient were reviewed by me and considered in my medical decision making (see chart for details).     Clinically patient's presentation and tenderness to the abdomen was concerning for possible appendicitis or gallbladder disease. Was not truly right upper quadrant not truly right lower quadrant. CT scan of the abdomen ended up being negative. Labs without any significant abnormalities. White blood cell  count was a little bit on the low side. Patient improved here with pain medicine and fluids. Patient will be treated symptomatically with Phenergan for nausea and vomiting and Norco for pain. M will follow up with her doctors.  Incidental finding on the CT scan showed a cystic lesion in the dome of the liver which will require MRI follow-up. Patient made aware of this. Her CT results provided to her. [facility] and arrange the outpatient MRI. This is most likely an incidental finding and not related to her presentation. Patient's liver function tests were without any significant abnormalities.  Final Clinical Impressions(s) / ED Diagnoses   Final diagnoses:  Right upper quadrant abdominal pain    New Prescriptions New Prescriptions   HYDROCODONE-ACETAMINOPHEN (NORCO/VICODIN) 5-325 MG TABLET    Take 1-2 tablets by mouth every 6 (six) hours as needed for moderate pain.   PROMETHAZINE (PHENERGAN) 25 MG TABLET    Take 1 tablet (25 mg total) by mouth every 6 (six) hours as needed for nausea or vomiting.     Fredia Sorrow, MD 09/28/16 [phone]

## 2016-09-28 NOTE — ED Triage Notes (Signed)
Patient c/o right lower abd pain since Monday that is intermittent with n/v/d and constipation.  Patient reports that pain is worse with walking or lying down.  Patient had 1 BM that was formed and watery in past 24 hours.

## 2017-06-14 ENCOUNTER — Encounter: Payer: Self-pay | Admitting: Obstetrics

## 2017-06-14 ENCOUNTER — Other Ambulatory Visit (HOSPITAL_COMMUNITY)
Admission: RE | Admit: 2017-06-14 | Discharge: 2017-06-14 | Disposition: A | Discharge status: Home / Self Care | Payer: Medicaid Other | Source: Ambulatory Visit | Attending: Obstetrics | Admitting: Obstetrics

## 2017-06-14 ENCOUNTER — Ambulatory Visit (INDEPENDENT_AMBULATORY_CARE_PROVIDER_SITE_OTHER): Payer: Medicaid Other | Admitting: Obstetrics

## 2017-06-14 VITALS — BP 162/110 | HR 64 | Ht 69.0 in | Wt 153.7 lb

## 2017-06-14 DIAGNOSIS — Z01419 Encounter for gynecological examination (general) (routine) without abnormal findings: Secondary | ICD-10-CM | POA: Diagnosis not present

## 2017-06-14 DIAGNOSIS — Z124 Encounter for screening for malignant neoplasm of cervix: Secondary | ICD-10-CM

## 2017-06-14 DIAGNOSIS — Z309 Encounter for contraceptive management, unspecified: Secondary | ICD-10-CM | POA: Diagnosis not present

## 2017-06-14 DIAGNOSIS — N898 Other specified noninflammatory disorders of vagina: Secondary | ICD-10-CM | POA: Diagnosis not present

## 2017-06-14 DIAGNOSIS — Z113 Encounter for screening for infections with a predominantly sexual mode of transmission: Secondary | ICD-10-CM

## 2017-06-14 DIAGNOSIS — F17210 Nicotine dependence, cigarettes, uncomplicated: Secondary | ICD-10-CM | POA: Diagnosis not present

## 2017-06-14 DIAGNOSIS — N939 Abnormal uterine and vaginal bleeding, unspecified: Secondary | ICD-10-CM

## 2017-06-14 MED ORDER — MEDROXYPROGESTERONE ACETATE 10 MG PO TABS: 10.0000 mg | ORAL_TABLET | Freq: Every day | ORAL | 0 refills | Status: DC

## 2017-06-14 NOTE — Progress Notes (Signed)
Subjective:        Krista [name] is a 42 y.o. female here for a routine exam.  Current complaints: Heavy vaginal bleeding since February.    Personal health questionnaire:  Is patient Ashkenazi Jewish, have a family history of breast and/or ovarian cancer: no Is there a family history of uterine cancer diagnosed at age < 41, gastrointestinal cancer, urinary tract cancer, family member who is a Field seismologist syndrome-associated carrier: no Is the patient overweight and hypertensive, family history of diabetes, personal history of gestational diabetes, preeclampsia or PCOS: no Is patient over 46, have PCOS,  family history of premature CHD under age 55, diabetes, smoke, have hypertension or peripheral artery disease:  no At any time, has a partner hit, kicked or otherwise hurt or frightened you?: no Over the past 2 weeks, have you felt down, depressed or hopeless?: no Over the past 2 weeks, have you felt little interest or pleasure in doing things?:no   Gynecologic History Patient's last menstrual period was 02/15/2017. Contraception: none Last Pap: 1997. Results were: normal Last mammogram: none. Results were: none  Obstetric History OB History  Gravida Para Term Preterm AB Living  2 1 1     1   SAB TAB Ectopic Multiple Live Births          1    # Outcome Date GA Lbr Len/2nd Weight Sex Delivery Anes PTL Lv  2 Term 07/09/07     Vag-Spont   LIV  1 Gravida             Past Medical History:  Diagnosis Date  . Migraine     History reviewed. No pertinent surgical history.   Current Outpatient Medications:  .  amLODipine (NORVASC) 10 MG tablet, Take 1 tablet (10 mg total) by mouth daily. (Patient not taking: Reported on 09/28/2016), Disp: 30 tablet, Rfl: 0 No Known Allergies  Social History   Tobacco Use  . Smoking status: Current Some Day Smoker    Types: Cigarettes  . Smokeless tobacco: Never Used  Substance Use Topics  . Alcohol use: No    Family History  Problem Relation Age  of Onset  . Cancer Mother   . Diabetes Mother   . Hypertension Mother   . Hypertension Father       Review of Systems  Constitutional: negative for fatigue and weight loss Respiratory: negative for cough and wheezing Cardiovascular: negative for chest pain, fatigue and palpitations Gastrointestinal: negative for abdominal pain and change in bowel habits Musculoskeletal:negative for myalgias Neurological: negative for gait problems and tremors Behavioral/Psych: negative for abusive relationship, depression Endocrine: negative for temperature intolerance    Genitourinary:POSITIVE for abnormal menstrual periods.  NEGATIVE for genital lesions, hot flashes, sexual problems and vaginal discharge Integument/breast: negative for breast lump, breast tenderness, nipple discharge and skin lesion(s)    Objective:       BP (!) 162/110   Pulse 64   Ht 5\' 9"  (1.753 m)   Wt 153 lb 11.2 oz (69.7 kg)   LMP 02/15/2017   BMI 22.70 kg/m  General:   alert  Skin:   no rash or abnormalities  Lungs:   clear to auscultation bilaterally  Heart:   regular rate and rhythm, S1, S2 normal, no murmur, click, rub or gallop  Breasts:   normal without suspicious masses, skin or nipple changes or axillary nodes  Abdomen:  normal findings: no organomegaly, soft, non-tender and no hernia  Pelvis:  External genitalia: normal general appearance Urinary system:  urethral meatus normal and bladder without fullness, nontender Vaginal: normal without tenderness, induration or masses Cervix: normal appearance Adnexa: normal bimanual exam Uterus: anteverted and non-tender, normal size   Lab Review Urine pregnancy test Labs reviewed yes Radiologic studies reviewed yes  50% of 20 min visit spent on counseling and coordination of care.   Assessment:     1. Encounter for routine gynecological examination with Papanicolaou smear of cervix  2. Abnormal uterine bleeding (AUB) Rx: - medroxyPROGESTERone (PROVERA)  10 MG tablet; Take 1 tablet (10 mg total) by mouth daily.  Dispense: 30 tablet; Refill: 0  3. Screening for STD (sexually transmitted disease) Rx: - HIV antibody - RPR  4. Vaginal discharge Rx: - Cervicovaginal ancillary only  5. Screening for cervical cancer Rx: - Cytology - PAP  6. Tobacco dependence due to cigarettes - program that includes medication and behavioral modification recommended     Plan:    Education reviewed: calcium supplements, depression evaluation, low fat, low cholesterol diet, safe sex/STD prevention, self breast exams, smoking cessation and weight bearing exercise. Contraception: abstinence. Mammogram ordered. Follow up in: a few months.   No orders of the defined types were placed in this encounter.  No orders of the defined types were placed in this encounter.   Shelly Bombard MD 06-14-2017

## 2017-06-14 NOTE — Progress Notes (Signed)
Pt here for annual and abnormal bleeding. She states that she has been bleeding sin 02/15/17. She is changing a pad a hour, with some large clots.

## 2017-06-15 LAB — CERVICOVAGINAL ANCILLARY ONLY
Bacterial vaginitis: NEGATIVE
CANDIDA VAGINITIS: NEGATIVE
Chlamydia: NEGATIVE
Neisseria Gonorrhea: NEGATIVE
Trichomonas: NEGATIVE

## 2017-06-15 LAB — RPR: RPR: NONREACTIVE

## 2017-06-15 LAB — HIV ANTIBODY (ROUTINE TESTING W REFLEX): HIV Screen 4th Generation wRfx: NONREACTIVE

## 2017-06-18 LAB — CYTOLOGY - PAP
DIAGNOSIS: NEGATIVE
HPV: NOT DETECTED

## 2017-07-12 ENCOUNTER — Telehealth: Payer: Self-pay

## 2017-07-12 NOTE — Telephone Encounter (Signed)
Message sent to Dr. Jodi Mourning, Patient got Provera in June but she is still bleeding. Please advice.

## 2018-11-05 IMAGING — CT CT ABD-PELV W/ CM
2 of 4 series · 16 of 46 positions shown, 18 images · IV contrast (ISOVUE)
Comparison: None.

CLINICAL DATA: Right lower quadrant pain since [REDACTED]. Nausea
vomiting.

EXAM:
CT ABDOMEN AND PELVIS WITH CONTRAST
TECHNIQUE: Multidetector CT imaging of the abdomen and pelvis was performed
using the standard protocol following bolus administration of
intravenous contrast.
CONTRAST:  100mL VIEPS4-0KK IOPAMIDOL (VIEPS4-0KK) INJECTION 61%

[Series 2: abd/pel with · axial · 0.64mm/px · z∈[-462,-62]mm · 13 of 90 slices shown, 15 images]
[im 5/90  soft-tissue]
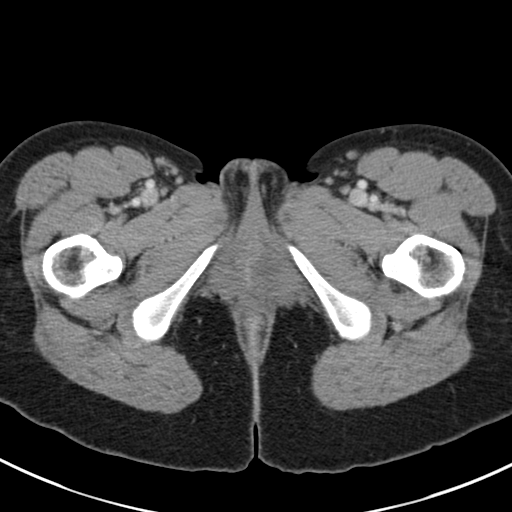
[im 5/90  bone]
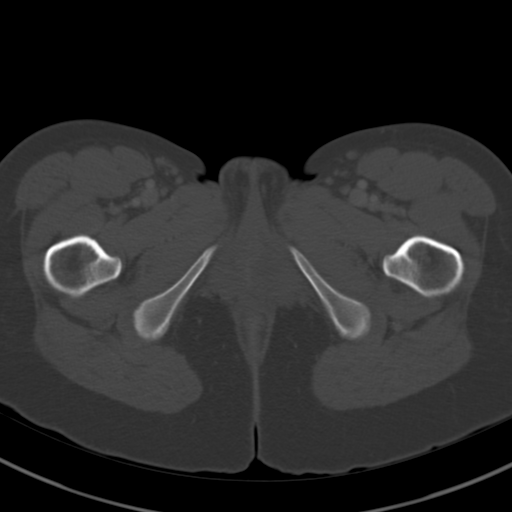
[im 10/90  soft-tissue]
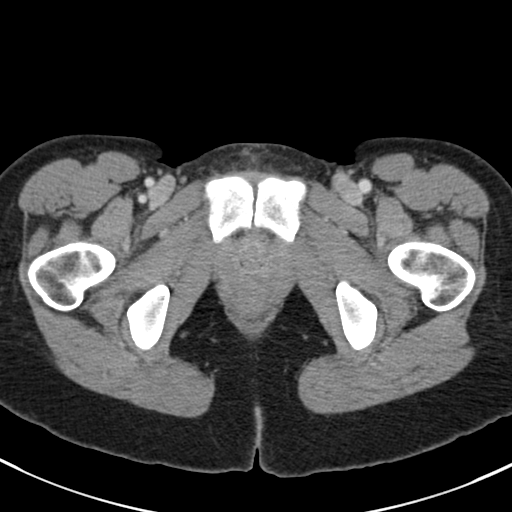
[im 20/90  soft-tissue]
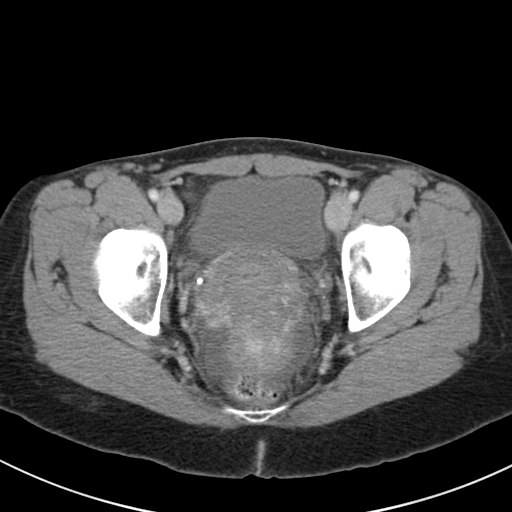
[im 25/90  soft-tissue]
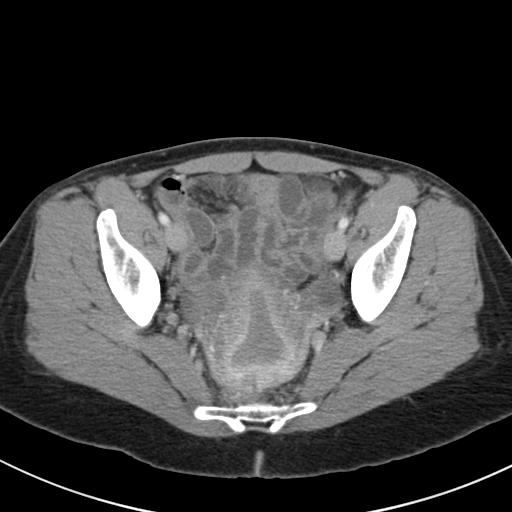
[im 30/90  soft-tissue]
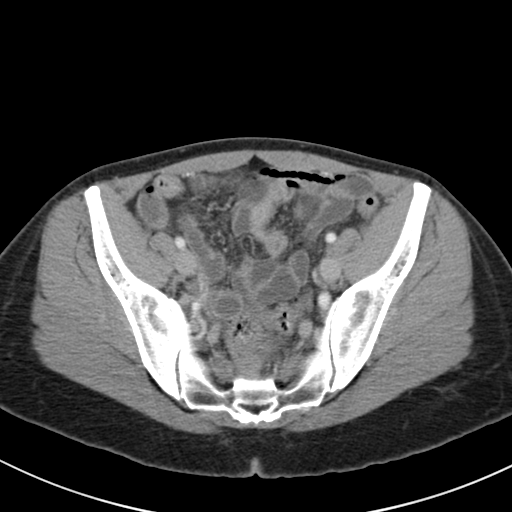
[im 40/90  soft-tissue]
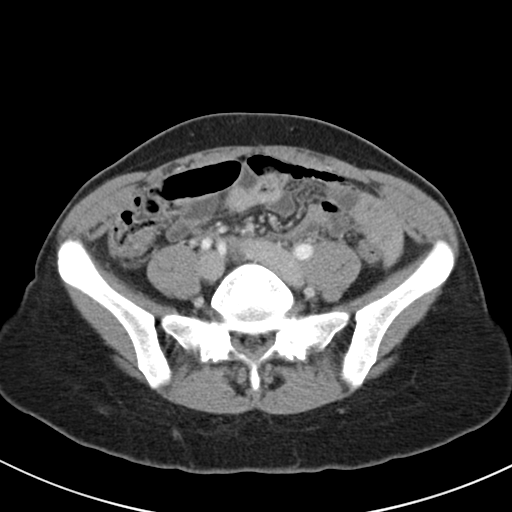
[im 45/90  soft-tissue]
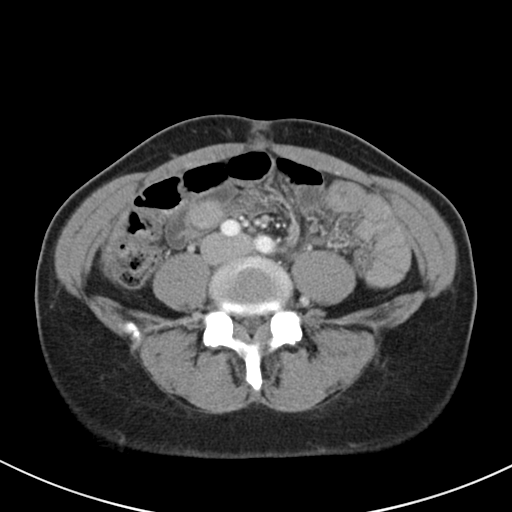
[im 50/90  soft-tissue]
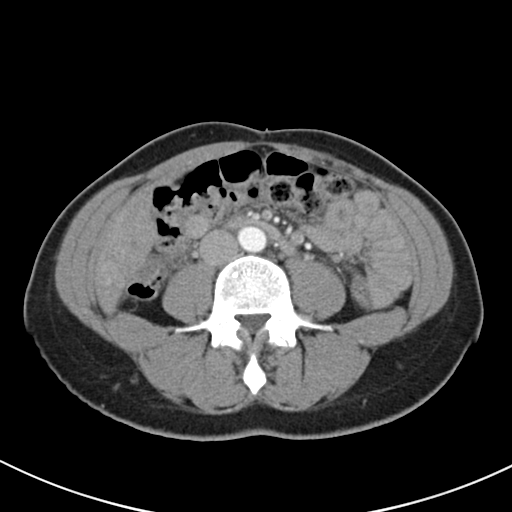
[im 60/90  soft-tissue]
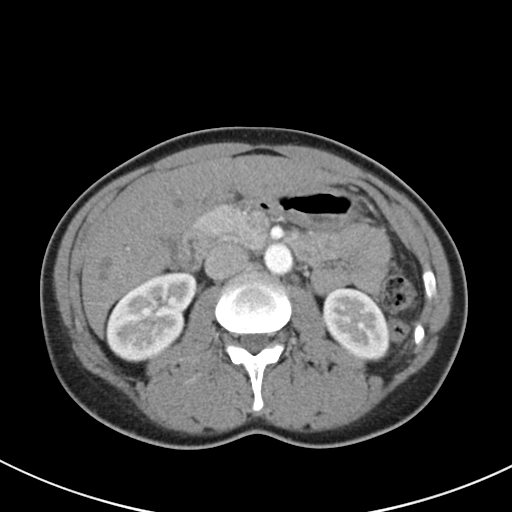
[im 60/90  bone]
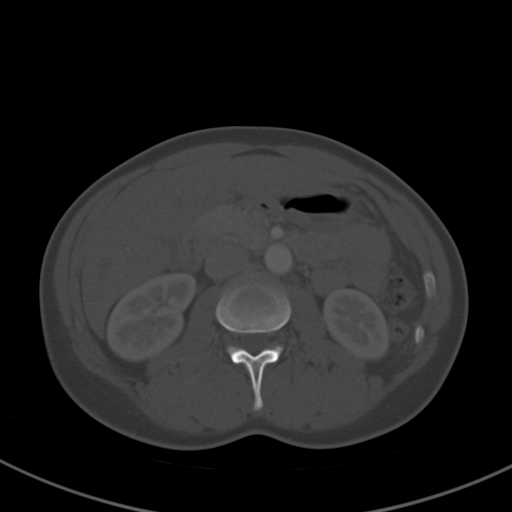
[im 65/90  soft-tissue]
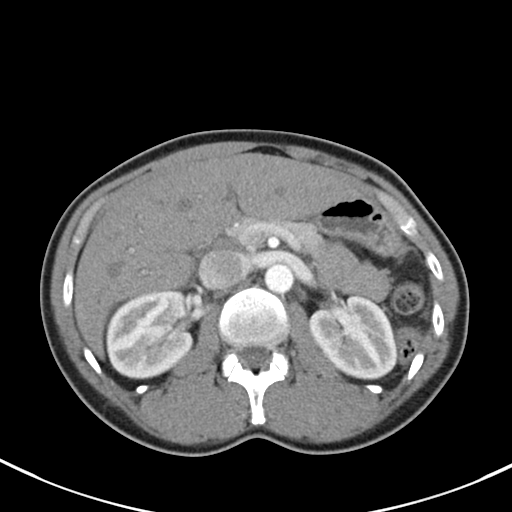
[im 70/90  soft-tissue]
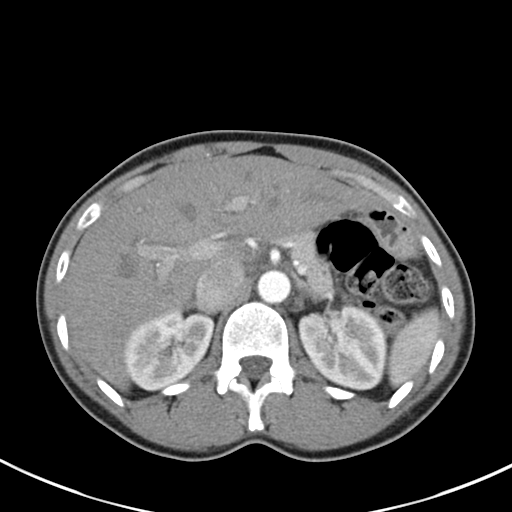
[im 80/90  soft-tissue]
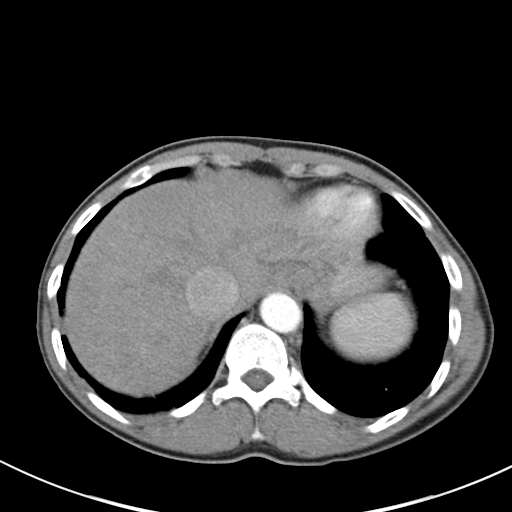
[im 85/90  soft-tissue]
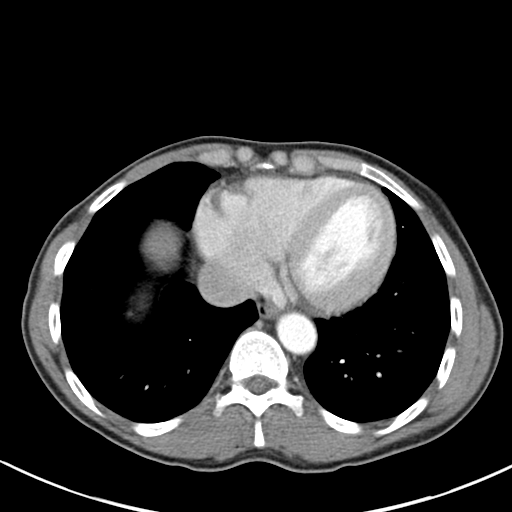

[Series 5: coronal a/|p · coronal · 0.74mm/px · 3 of 113 slices shown]
[im 38/113  soft-tissue]
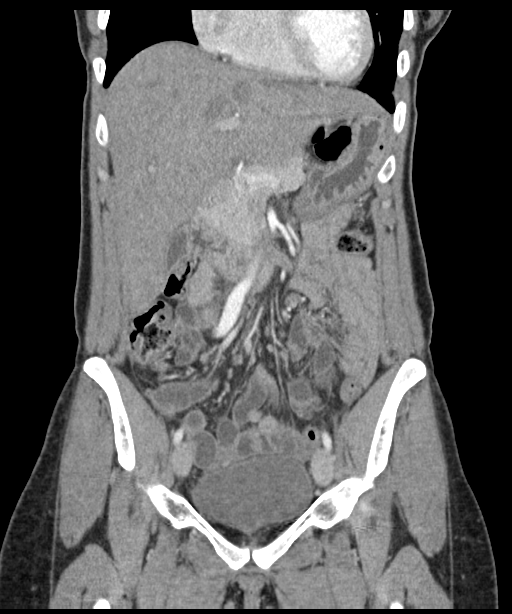
[im 50/113  soft-tissue]
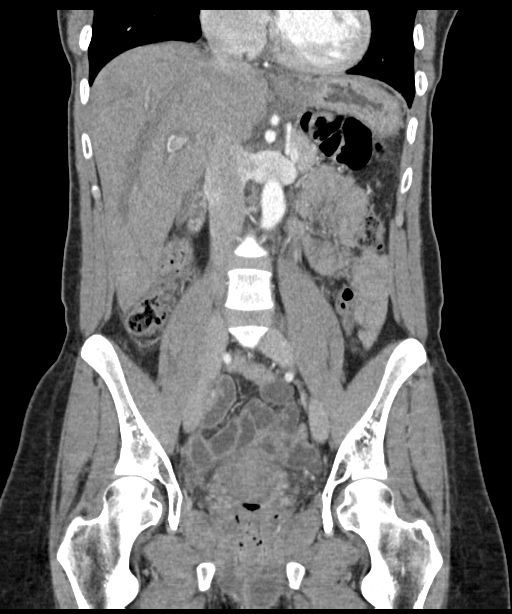
[im 63/113  soft-tissue]
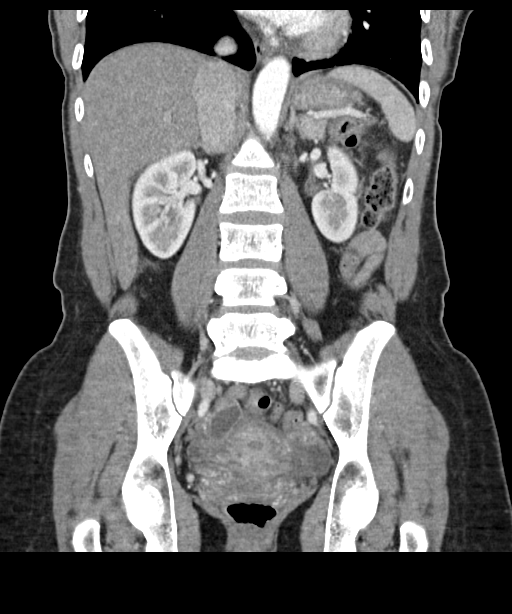

[16 of 46 positions shown; findings below may reference images not displayed]

FINDINGS: Lower chest:  Unremarkable.

Hepatobiliary: 2.7 cm non cystic lesion identified in the dome of
the liver (image 10 series 2). There is no evidence for gallstones,
gallbladder wall thickening, or pericholecystic fluid. No
intrahepatic or extrahepatic biliary dilation.

Pancreas: No focal mass lesion. No dilatation of the main duct. No
intraparenchymal cyst. No peripancreatic edema.

Spleen: No splenomegaly. No focal mass lesion.

Adrenals/Urinary Tract: No adrenal nodule or mass. Kidneys are
unremarkable. No evidence for hydroureter. The urinary bladder
appears normal for the degree of distention.

Stomach/Bowel: Stomach is nondistended. No gastric wall thickening.
No evidence of outlet obstruction. Duodenum is normally positioned
as is the ligament of Treitz. No small bowel wall thickening. No
small bowel dilatation. The terminal ileum is normal. Air-filled
appendix measures 5 mm diameter. No periappendiceal edema or
inflammation.No gross colonic mass. No colonic wall thickening. No
substantial diverticular change.

Vascular/Lymphatic: No abdominal aortic aneurysm. No abdominal
aortic atherosclerotic calcification. Upper normal all anterior
right juxta diaphragmatic lymph node identified. There is no
gastrohepatic or hepatoduodenal ligament lymphadenopathy. No
intraperitoneal or retroperitoneal lymphadenopathy. No pelvic
sidewall lymphadenopathy.

Reproductive: The uterus has normal CT imaging appearance. There is
no adnexal mass.

Other: Tiny amount of fluid is identified in the cul-de-sac.

Musculoskeletal: Bone windows reveal no worrisome lytic or sclerotic
osseous lesions.
IMPRESSION: 1. No acute findings in the abdomen or pelvis to explain the
patient's history of right lower quadrant pain.
2. 2.7 cm non cystic lesion identified in the hepatic dome. MRI
without with contrast recommended to further evaluate. Use of Eovist
contrast material recommended.
3. Trace fluid in the cul-de-sac can be physiologic in a
premenopausal female.

## 2020-01-23 ENCOUNTER — Other Ambulatory Visit: Payer: Self-pay

## 2020-01-23 ENCOUNTER — Other Ambulatory Visit (HOSPITAL_COMMUNITY)
Admission: RE | Admit: 2020-01-23 | Discharge: 2020-01-23 | Disposition: A | Discharge status: Home / Self Care | Payer: Medicaid Other | Source: Ambulatory Visit | Attending: Obstetrics | Admitting: Obstetrics

## 2020-01-23 ENCOUNTER — Ambulatory Visit (INDEPENDENT_AMBULATORY_CARE_PROVIDER_SITE_OTHER): Payer: Self-pay | Admitting: Obstetrics

## 2020-01-23 ENCOUNTER — Encounter: Payer: Self-pay | Admitting: Obstetrics

## 2020-01-23 VITALS — BP 155/107 | HR 61 | Ht 69.0 in | Wt 163.0 lb

## 2020-01-23 DIAGNOSIS — N938 Other specified abnormal uterine and vaginal bleeding: Secondary | ICD-10-CM | POA: Insufficient documentation

## 2020-01-23 DIAGNOSIS — Z01411 Encounter for gynecological examination (general) (routine) with abnormal findings: Secondary | ICD-10-CM | POA: Insufficient documentation

## 2020-01-23 DIAGNOSIS — N939 Abnormal uterine and vaginal bleeding, unspecified: Secondary | ICD-10-CM | POA: Diagnosis present

## 2020-01-23 DIAGNOSIS — R102 Pelvic and perineal pain: Secondary | ICD-10-CM | POA: Insufficient documentation

## 2020-01-23 DIAGNOSIS — Z30018 Encounter for initial prescription of other contraceptives: Secondary | ICD-10-CM | POA: Insufficient documentation

## 2020-01-23 MED ORDER — FERROUS SULFATE 325 (65 FE) MG PO TABS: 325.0000 mg | ORAL_TABLET | ORAL | 5 refills | Status: DC

## 2020-01-23 MED ORDER — NORETHINDRONE ACETATE 5 MG PO TABS: 10.0000 mg | ORAL_TABLET | Freq: Every day | ORAL | 0 refills | Status: DC

## 2020-01-23 NOTE — Progress Notes (Signed)
Patient ID: Krista [name], female   DOB: 15-Jan-1975, 45 y.o.   MRN: [id number]  Chief Complaint  Patient presents with  . Menstrual Problem    HPI Krista [name] is a 45 y.o. female.  Complains of heavy and prolonged periods. HPI  Past Medical History:  Diagnosis Date  . Migraine     History reviewed. No pertinent surgical history.  Family History  Problem Relation Age of Onset  . Cancer Mother   . Diabetes Mother   . Hypertension Mother   . Hypertension Father     Social History Social History   Tobacco Use  . Smoking status: Current Some Day Smoker    Types: Cigarettes  . Smokeless tobacco: Never Used  Vaping Use  . Vaping Use: Never used  Substance Use Topics  . Alcohol use: No  . Drug use: No    No Known Allergies  Current Outpatient Medications  Medication Sig Dispense Refill  . norethindrone (AYGESTIN) 5 MG tablet Take 2 tablets (10 mg total) by mouth daily. 30 tablet 0  . amLODipine (NORVASC) 10 MG tablet Take 1 tablet (10 mg total) by mouth daily. (Patient not taking: No sig reported) 30 tablet 0   No current facility-administered medications for this visit.    Review of Systems Review of Systems Constitutional: negative for fatigue and weight loss Respiratory: negative for cough and wheezing Cardiovascular: negative for chest pain, fatigue and palpitations Gastrointestinal: negative for abdominal pain and change in bowel habits Genitourinary: positive for heavy and prolonged periods Integument/breast: negative for nipple discharge Musculoskeletal:negative for myalgias Neurological: negative for gait problems and tremors Behavioral/Psych: negative for abusive relationship, depression Endocrine: negative for temperature intolerance      Blood pressure (!) 155/107, pulse 61, height 5\' 9"  (1.753 m), weight 163 lb (73.9 kg), last menstrual period 01/09/2020.  Physical Exam Physical Exam           General: Alert and no distress Abdomen:  normal  findings: no organomegaly, soft, non-tender and no hernia  Pelvis:  External genitalia: normal general appearance Urinary system: urethral meatus normal and bladder without fullness, nontender Vaginal: normal without tenderness, induration or masses Cervix: normal appearance Adnexa: normal bimanual exam Uterus: anteverted and non-tender, normal size    50% of 15 min visit spent on counseling and coordination of care.   Data Reviewed CBC  Assessment     1. Abnormal uterine bleeding (AUB) Rx: - CBC - norethindrone (AYGESTIN) 5 MG tablet; Take 2 tablets (10 mg total) by mouth daily.  Dispense: 30 tablet; Refill: 0 - US PELVIC COMPLETE WITH TRANSVAGINAL; Future - ferrous sulfate 325 (65 FE) MG tablet; Take 1 tablet (325 mg total) by mouth every other day.  Dispense: 60 tablet; Refill: 5   Plan   Follow up in 2 weeks  Orders Placed This Encounter  Procedures  . US PELVIC COMPLETE WITH TRANSVAGINAL    Standing Status:   Future    Standing Expiration Date:   01/22/2021    Order Specific Question:   Reason for Exam (SYMPTOM  OR DIAGNOSIS REQUIRED)    Answer:   AUB    Order Specific Question:   Preferred imaging location?    Answer:   [facility]  . CBC   Meds ordered this encounter  Medications  . norethindrone (AYGESTIN) 5 MG tablet    Sig: Take 2 tablets (10 mg total) by mouth daily.    Dispense:  30 tablet    Refill:  0  Krista Bombard, MD 01/23/2020 10:29 AM

## 2020-01-23 NOTE — Addendum Note (Signed)
Addended by: Courtney Heys on: 01/23/2020 11:44 AM   Modules accepted: Orders

## 2020-01-23 NOTE — Progress Notes (Signed)
RGYN patient presents for problem visit.   Patient has not been seen since 2019 w/complaints of heavy bleeding no F/U appt was ever scheduled.   CC: Still having bleeding pt has been bleeding x 1 month LMP:01/09/2020  Heavy w/clots. Pt also notes sharp lower pelvic pain.

## 2020-01-23 NOTE — Patient Instructions (Signed)
Abnormal Uterine Bleeding  Abnormal uterine bleeding is unusual bleeding from the uterus. It includes bleeding after sex, or bleeding or spotting between menstrual periods. It may also include bleeding that is heavier than normal, menstrual periods that last longer than usual, or bleeding that occurs after menopause. Abnormal uterine bleeding can affect teenagers, women in their reproductive years, pregnant women, and women who have reached menopause. Common causes of abnormal uterine bleeding include:  Pregnancy.  Growths of tissue (polyps).  Benign tumors or growths in the uterus (fibroids). These are not cancer.  Infection.  Cancer.  Too much or too little of some hormones in the body (hormonal imbalances). Any type of abnormal bleeding should be checked by a health care provider. Many cases are minor and simple to treat, but others may be more serious. Treatment will depend on the cause and severity of the bleeding. Follow these instructions at home: Medicines  Take over-the-counter and prescription medicines only as told by your health care provider.  Tell your health care provider about other medicines that you take. You may be asked to stop taking aspirin or medicines that contain aspirin. These medicines can make bleeding worse.  If you were prescribed iron pills, take them as told by your health care provider. Iron pills help to replace iron that your body loses because of this condition. Managing constipation In cases of severe bleeding, you may be asked to increase your iron intake to treat anemia. This may cause constipation. To prevent or treat constipation, you may need to:  Drink enough fluid to keep your urine pale yellow.  Take over-the-counter or prescription medicines.  Eat foods that are high in fiber, such as beans, whole grains, and fresh fruits and vegetables.  Limit foods that are high in fat and processed sugars, such as fried or sweet foods. General  instructions  Monitor your condition for any changes.  Do not use tampons, douche, or have sex until your health care provider says these things are okay.  Change your pads often.  Get regular exams. This includes pelvic exams and cervical cancer screenings. ? It is up to you to get the results of any tests that are done. Ask your health care provider, or the department that is doing the tests, when your results will be ready.  Keep all follow-up visits as told by your health care provider. This is important. Contact a health care provider if you:  Have bleeding that lasts for more than 1 week.  Feel dizzy at times.  Feel nauseous or you vomit.  Feel light-headed or weak.  Notice any other changes that show that your condition is getting worse. Get help right away if you:  Pass out.  Have bleeding that soaks through a pad every hour.  Have pain in the abdomen.  Have a fever or chills.  Become sweaty or weak.  Pass large blood clots from your vagina. Summary  Abnormal uterine bleeding is unusual bleeding from the uterus.  Any type of abnormal bleeding should be evaluated by a health care provider. Many cases are minor and simple to treat, but others may be more serious.  Treatment will depend on the cause of the bleeding.  Get help right away if you pass out, you have bleeding that soaks through a pad every hour, or you pass large blood clots from your vagina. This information is not intended to replace advice given to you by your health care provider. Make sure you discuss any questions you  have with your health care provider. Document Revised: 08/27/2019 Document Reviewed: 10/22/2018 Elsevier Patient Education  2021 [place].  Dysfunctional Uterine Bleeding Dysfunctional uterine bleeding is abnormal bleeding from the uterus. Dysfunctional uterine bleeding includes:  A menstrual period that comes earlier or later than usual.  A menstrual period that is  lighter or heavier than usual, or has large blood clots.  Vaginal bleeding between menstrual periods.  Skipping one or more menstrual periods.  Vaginal bleeding after sex.  Vaginal bleeding after menopause. Follow these instructions at home: Eating and drinking  Eat well-balanced meals. Include foods that are high in iron, such as liver, meat, shellfish, green leafy vegetables, and eggs.  To prevent or treat constipation, your health care provider may recommend that you: ? Drink enough fluid to keep your urine pale yellow. ? Take over-the-counter or prescription medicines. ? Eat foods that are high in fiber, such as beans, whole grains, and fresh fruits and vegetables. ? Limit foods that are high in fat and processed sugars, such as fried or sweet foods.   Medicines  Take over-the-counter and prescription medicines only as told by your health care provider.  Do not change medicines without talking with your health care provider.  Aspirin or medicines that contain aspirin may make the bleeding worse. Do not take those medicines: ? During the week before your menstrual period. ? During your menstrual period.  If you were prescribed iron pills, take them as told by your health care provider. Iron pills help to replace iron that your body loses because of this condition. Activity  If you need to change your sanitary pad or tampon more than one time every 2 hours: ? Lie in bed with your feet raised (elevated). ? Place a cold pack on your lower abdomen. ? Rest as much as possible until the bleeding stops or slows down.  Do not try to lose weight until the bleeding has stopped and your blood iron level is back to normal. General instructions  For two months, write down: ? When your menstrual period starts. ? When your menstrual period ends. ? When any abnormal vaginal bleeding occurs. ? What problems you notice.  Keep all follow up visits as told by your health care provider.  This is important.   Contact a health care provider if you:  Feel light-headed or weak.  Have nausea and vomiting.  Cannot eat or drink without vomiting.  Feel dizzy or have diarrhea while you are taking medicines.  Are taking birth control pills or hormones, and you want to change them or stop taking them. Get help right away if:  You develop a fever or chills.  You need to change your sanitary pad or tampon more than one time per hour.  Your vaginal bleeding becomes heavier, or your flow contains clots more often.  You develop pain in your abdomen.  You lose consciousness.  You develop a rash. Summary  Dysfunctional uterine bleeding is abnormal bleeding from the uterus.  It includes menstrual bleeding of abnormal duration, volume, or regularity.  Bleeding after sex and after menopause are also considered dysfunctional uterine bleeding. This information is not intended to replace advice given to you by your health care provider. Make sure you discuss any questions you have with your health care provider. Document Revised: 05/30/2017 Document Reviewed: 05/30/2017 Elsevier Patient Education  2021 [place].  Endometrial Biopsy  An endometrial biopsy is a procedure to remove tissue samples from the endometrium, which is the lining  of the uterus. The tissue that is removed can then be checked under a microscope for disease. This procedure is used to diagnose conditions such as endometrial cancer, endometrial tuberculosis, polyps, or other inflammatory conditions. This procedure may also be used to investigate uterine bleeding to determine where you are in your menstrual cycle or how your hormone levels are affecting the lining of the uterus. Tell a health care provider about:  Any allergies you have.  All medicines you are taking, including vitamins, herbs, eye drops, creams, and over-the-counter medicines.  Any problems you or family members have had with anesthetic  medicines.  Any blood disorders you have.  Any surgeries you have had.  Any medical conditions you have.  Whether you are pregnant or may be pregnant. What are the risks? Generally, this is a safe procedure. However, problems may occur, including:  Bleeding.  Pelvic infection.  Puncture of the wall of the uterus with the biopsy device (rare).  Allergic reactions to medicines. What happens before the procedure?  Keep a record of your menstrual cycles as told by your health care provider. You may need to schedule your procedure for a specific time in your cycle.  You may want to bring a sanitary pad to wear after the procedure.  Plan to have someone take you home from the hospital or clinic.  Ask your health care provider about: ? Changing or stopping your regular medicines. This is especially important if you are taking diabetes medicines, arthritis medicines, or blood thinners. ? Taking medicines such as aspirin and ibuprofen. These medicines can thin your blood. Do not take these medicines unless your health care provider tells you to take them. ? Taking over-the-counter medicines, vitamins, herbs, and supplements. What happens during the procedure?  You will lie on an exam table with your feet and legs supported as in a pelvic exam.  Your health care provider will insert an instrument (speculum) into your vagina to see your cervix.  Your cervix will be cleansed with an antiseptic solution.  A medicine (local anesthetic) will be used to numb the cervix.  A forceps instrument (tenaculum) will be used to hold your cervix steady for the biopsy.  A thin, rod-like instrument (uterine sound) will be inserted through your cervix to determine the length of your uterus and the location where the biopsy sample will be removed.  A thin, flexible tube (catheter) will be inserted through your cervix and into the uterus. The catheter will be used to collect the biopsy sample from your  endometrial tissue.  The catheter and speculum will then be removed, and the tissue sample will be sent to a lab for examination. The procedure may vary among health care providers and hospitals. What can I expect after procedure?  You will rest in a recovery area until you are ready to go home.  You may have mild cramping and a small amount of vaginal bleeding. This is normal.  You may have a small amount of vaginal bleeding for a few days. This is normal.  It is up to you to get the results of your procedure. Ask your health care provider, or the department that is doing the procedure, when your results will be ready. Follow these instructions at home:  Take over-the-counter and prescription medicines only as told by your health care provider.  Do not douche, use tampons, or have sexual intercourse until your health care provider approves.  Return to your normal activities as told by your health  care provider. Ask your health care provider what activities are safe for you.  Follow instructions from your health care provider about any activity restrictions, such as restrictions on strenuous exercise or heavy lifting.  Keep all follow-up visits. This is important. Contact a health care provider:  You have heavy bleeding, or bleed for longer than 2 days after the procedure.  You have bad smelling discharge from your vagina.  You have a fever or chills.  You have a burning sensation when urinating or you have difficulty urinating.  You have severe pain in your lower abdomen. Get help right away if you:  You have severe cramps in your stomach or back.  You pass large blood clots.  Your bleeding increases.  You become weak or light-headed, or you faint or lose consciousness. Summary  An endometrial biopsy is a procedure to remove tissue samples is taken from the endometrium, which is the lining of the uterus.  The tissue sample that is removed will be checked under a  microscope for disease.  This procedure is used to diagnose conditions such as endometrial cancer, endometrial tuberculosis, polyps, or other inflammatory conditions.  After the procedure, it is common to have mild cramping and a small amount of vaginal bleeding for a few days.  Do not douche, use tampons, or have sexual intercourse until your health care provider approves. Ask your health care provider which activities are safe for you. This information is not intended to replace advice given to you by your health care provider. Make sure you discuss any questions you have with your health care provider. Document Revised: 07/14/2019 Document Reviewed: 07/14/2019 Elsevier Patient Education  2021 [employer].

## 2020-01-24 LAB — CBC
Hematocrit: 33.1 % — ABNORMAL LOW (ref 34.0–46.6)
Hemoglobin: 10.9 g/dL — ABNORMAL LOW (ref 11.1–15.9)
MCH: 28.9 pg (ref 26.6–33.0)
MCHC: 32.9 g/dL (ref 31.5–35.7)
MCV: 88 fL (ref 79–97)
Platelets: 308 10*3/uL (ref 150–450)
RBC: 3.77 x10E6/uL (ref 3.77–5.28)
RDW: 13.2 % (ref 11.7–15.4)
WBC: 4.1 10*3/uL (ref 3.4–10.8)

## 2020-01-26 ENCOUNTER — Telehealth: Payer: Self-pay | Admitting: *Deleted

## 2020-01-26 LAB — CERVICOVAGINAL ANCILLARY ONLY
Bacterial Vaginitis (gardnerella): NEGATIVE
Candida Glabrata: NEGATIVE
Candida Vaginitis: NEGATIVE
Chlamydia: NEGATIVE
Comment: NEGATIVE
Comment: NEGATIVE
Comment: NEGATIVE
Comment: NEGATIVE
Comment: NEGATIVE
Comment: NORMAL
Neisseria Gonorrhea: NEGATIVE
Trichomonas: NEGATIVE

## 2020-01-26 NOTE — Telephone Encounter (Signed)
Pt had moderna 1st dose on 04-30-2019 and 2nd dose on 06-03-2019 and pt is sch for her moderna booster on 01-27-2020 at [facility]

## 2020-01-27 ENCOUNTER — Ambulatory Visit: Payer: Medicaid Other

## 2020-01-27 ENCOUNTER — Other Ambulatory Visit: Payer: Self-pay | Admitting: Obstetrics

## 2020-02-10 ENCOUNTER — Ambulatory Visit: Payer: Medicaid Other | Attending: Obstetrics

## 2020-02-17 ENCOUNTER — Telehealth (INDEPENDENT_AMBULATORY_CARE_PROVIDER_SITE_OTHER): Payer: Self-pay | Admitting: Obstetrics

## 2020-02-17 ENCOUNTER — Encounter: Payer: Self-pay | Admitting: Obstetrics

## 2020-02-17 DIAGNOSIS — N939 Abnormal uterine and vaginal bleeding, unspecified: Secondary | ICD-10-CM

## 2020-02-17 DIAGNOSIS — I10 Essential (primary) hypertension: Secondary | ICD-10-CM

## 2020-02-17 DIAGNOSIS — G43009 Migraine without aura, not intractable, without status migrainosus: Secondary | ICD-10-CM

## 2020-02-17 MED ORDER — NORETHINDRONE ACETATE 5 MG PO TABS: 10.0000 mg | ORAL_TABLET | Freq: Every day | ORAL | 0 refills | Status: DC

## 2020-02-17 MED ORDER — NORETHINDRONE ACETATE 5 MG PO TABS: 5.0000 mg | ORAL_TABLET | Freq: Every day | ORAL | 0 refills | Status: DC

## 2020-02-17 NOTE — Progress Notes (Addendum)
    GYNECOLOGY VIRTUAL VISIT ENCOUNTER NOTE  Provider location: [facility] [place]   I connected with Krista [name] on 02/17/20 at  8:15 AM EST by MyChart Video Encounter at home and verified that I am speaking with the correct person using two identifiers.   I discussed the limitations, risks, security and privacy concerns of performing an evaluation and management service virtually and the availability of in person appointments. I also discussed with the patient that there may be a patient responsible charge related to this service. The patient expressed understanding and agreed to proceed.   History:  Krista [name] is a 45 y.o. G20P1001 female being evaluated today for history of irregular cycles. She denies any abnormal vaginal discharge, pain or other concerns.       Past Medical History:  Diagnosis Date  . Migraine    No past surgical history on file. The following portions of the patient's history were reviewed and updated as appropriate: allergies, current medications, past family history, past medical history, past social history, past surgical history and problem list.   Health Maintenance:  Normal pap and negative HRHPV on 2019.  Normal mammogram on unknown.   Review of Systems:  Pertinent items noted in HPI and remainder of comprehensive ROS otherwise negative.  Physical Exam:   General:  Alert, oriented and cooperative. Patient appears to be in no acute distress.  Mental Status: Normal mood and affect. Normal behavior. Normal judgment and thought content.   Respiratory: Normal respiratory effort, no problems with respiration noted  Rest of physical exam deferred due to type of encounter  Labs and Imaging No results found for this or any previous visit (from the past 336 hour(s)). No results found.     Assessment and Plan:        1. Abnormal uterine bleeding (AUB) - O - needs cycle regulation and hormonal balance - ultrasound ordered - will try to  avoid estrogen with her history of HTN and migraines Rx: - norethindrone (AYGESTIN) 5 MG tablet; Take 1 tablet (5 mg total) by mouth daily.  Dispense: 30 tablet; Refill: 0  2. HTN (hypertension), benign - not on meds  3. Migraine without aura and without status migrainosus, not intractable - clinically stable   Follow up after ultrasound for an endometrial biopsy.  I discussed the assessment and treatment plan with the patient. The patient was provided an opportunity to ask questions and all were answered. The patient agreed with the plan and demonstrated an understanding of the instructions.   The patient was advised to call back or seek an in-person evaluation/go to the ED if the symptoms worsen or if the condition fails to improve as anticipated.  I provided 15 minutes of face-to-face time during this encounter.   Baltazar Najjar, MD [facility], [facility] 02/17/20

## 2020-02-24 ENCOUNTER — Telehealth (INDEPENDENT_AMBULATORY_CARE_PROVIDER_SITE_OTHER): Payer: Medicaid Other | Admitting: Obstetrics

## 2020-02-24 ENCOUNTER — Encounter: Payer: Self-pay | Admitting: Obstetrics

## 2020-02-24 ENCOUNTER — Telehealth: Payer: Self-pay

## 2020-02-24 ENCOUNTER — Other Ambulatory Visit: Payer: Self-pay | Admitting: Obstetrics

## 2020-02-24 DIAGNOSIS — D500 Iron deficiency anemia secondary to blood loss (chronic): Secondary | ICD-10-CM

## 2020-02-24 DIAGNOSIS — Z1239 Encounter for other screening for malignant neoplasm of breast: Secondary | ICD-10-CM

## 2020-02-24 DIAGNOSIS — N939 Abnormal uterine and vaginal bleeding, unspecified: Secondary | ICD-10-CM

## 2020-02-24 DIAGNOSIS — G43009 Migraine without aura, not intractable, without status migrainosus: Secondary | ICD-10-CM

## 2020-02-24 DIAGNOSIS — I10 Essential (primary) hypertension: Secondary | ICD-10-CM

## 2020-02-24 DIAGNOSIS — D508 Other iron deficiency anemias: Secondary | ICD-10-CM

## 2020-02-24 MED ORDER — AMLODIPINE BESYLATE 5 MG PO TABS: 5.0000 mg | ORAL_TABLET | Freq: Every day | ORAL | 11 refills | Status: DC

## 2020-02-24 MED ORDER — MEGESTROL ACETATE 40 MG PO TABS: 40.0000 mg | ORAL_TABLET | Freq: Two times a day (BID) | ORAL | 1 refills | Status: DC

## 2020-02-24 MED ORDER — FERROUS SULFATE 325 (65 FE) MG PO TABS: 325.0000 mg | ORAL_TABLET | ORAL | 5 refills | Status: DC

## 2020-02-24 MED ORDER — TRIAMTERENE-HCTZ 37.5-25 MG PO CAPS: 1.0000 | ORAL_CAPSULE | Freq: Every day | ORAL | 11 refills | Status: DC

## 2020-02-24 MED ORDER — VITAFOL ULTRA 29-0.6-0.4-200 MG PO CAPS
1.0000 | ORAL_CAPSULE | Freq: Every day | ORAL | 4 refills | Status: DC
Start: 2020-02-24 — End: 2020-07-01

## 2020-02-24 NOTE — Telephone Encounter (Signed)
TC called from patient she is still bleeding and birth control pills are not working. She reported soaking 1-2 pads per hour. I recommended patient go to the ER but she would like a phone from the provider. Please call her.

## 2020-02-24 NOTE — Progress Notes (Signed)
   TELEHEALTH GYNECOLOGY VISIT ENCOUNTER NOTE  Provider location: [facility] [place]   I connected with Krista [name] on 02/24/20 at 11:00 AM EST by telephone at home and verified that I am speaking with the correct person using two identifiers. Patient was unable to do MyChart audiovisual encounter due to technical difficulties, she tried several times.    I discussed the limitations, risks, security and privacy concerns of performing an evaluation and management service by telephone and the availability of in person appointments. I also discussed with the patient that there may be a patient responsible charge related to this service. The patient expressed understanding and agreed to proceed.   History:  Krista [name] is a 45 y.o. G40P1001 female being evaluated today for history of heavy and prolonged periods.  She was started on Aygestin but continues to have heavy bleeding. She denies any abnormal vaginal discharge,  pelvic pain or other concerns.       Past Medical History:  Diagnosis Date  . Migraine    History reviewed. No pertinent surgical history. The following portions of the patient's history were reviewed and updated as appropriate: allergies, current medications, past family history, past medical history, past social history, past surgical history and problem list.   Health Maintenance:  Normal pap and negative HRHPV on 2019.  No mammogram on record.   Review of Systems:  Pertinent items noted in HPI and remainder of comprehensive ROS otherwise negative.  Physical Exam:   General:  Alert, oriented and cooperative.   Mental Status: Normal mood and affect perceived. Normal judgment and thought content.  Physical exam deferred due to nature of the encounter  Labs and Imaging No results found for this or any previous visit (from the past 336 hour(s)). No results found.     Assessment and Plan:     1. Abnormal uterine bleeding (AUB) Rx: - US PELVIC  COMPLETE WITH TRANSVAGINAL; Future  2. Iron deficiency anemia due to chronic blood loss Rx: - FeSO4 325 mg po every other day  - Vitafol Ultra 1 po daily  3. HTN (hypertension), benign Rx: - Ambulatory referral to Internal Medicine - Norvasc 5 mg po daily in am - Dyazide 37.5 / 25 mg po daily po in am  4. Migraine without aura and without status migrainosus, not intractable - clinically stable and on no meds  5. Screening breast examination Rx: - MM Digital Screening; Future     Follow up in 2 weeks for Annual / Pap and Endometrial Biopsy  I discussed the assessment and treatment plan with the patient. The patient was provided an opportunity to ask questions and all were answered. The patient agreed with the plan and demonstrated an understanding of the instructions.   The patient was advised to call back or seek an in-person evaluation/go to the ED if the symptoms worsen or if the condition fails to improve as anticipated.  I provided 15 minutes of non-face-to-face time during this encounter.   Baltazar Najjar, MD [facility], [facility] 02/24/20

## 2020-03-03 ENCOUNTER — Ambulatory Visit: Payer: Medicaid Other

## 2020-03-08 ENCOUNTER — Other Ambulatory Visit: Payer: Self-pay

## 2020-03-08 ENCOUNTER — Ambulatory Visit
Admission: RE | Admit: 2020-03-08 | Discharge: 2020-03-08 | Disposition: A | Discharge status: Home / Self Care | Payer: Medicaid Other | Source: Ambulatory Visit | Attending: Obstetrics | Admitting: Obstetrics

## 2020-03-08 DIAGNOSIS — N939 Abnormal uterine and vaginal bleeding, unspecified: Secondary | ICD-10-CM

## 2020-03-30 ENCOUNTER — Other Ambulatory Visit: Payer: Self-pay

## 2020-03-30 ENCOUNTER — Encounter: Payer: Self-pay | Admitting: Obstetrics

## 2020-03-30 ENCOUNTER — Other Ambulatory Visit (HOSPITAL_COMMUNITY)
Admission: RE | Admit: 2020-03-30 | Discharge: 2020-03-30 | Disposition: A | Discharge status: Home / Self Care | Payer: Medicaid Other | Source: Ambulatory Visit | Attending: Obstetrics | Admitting: Obstetrics

## 2020-03-30 ENCOUNTER — Ambulatory Visit (INDEPENDENT_AMBULATORY_CARE_PROVIDER_SITE_OTHER): Payer: Medicaid Other | Admitting: Obstetrics

## 2020-03-30 VITALS — BP 161/107 | HR 66 | Ht 69.0 in | Wt 151.0 lb

## 2020-03-30 DIAGNOSIS — Z01419 Encounter for gynecological examination (general) (routine) without abnormal findings: Secondary | ICD-10-CM | POA: Diagnosis present

## 2020-03-30 DIAGNOSIS — G43009 Migraine without aura, not intractable, without status migrainosus: Secondary | ICD-10-CM

## 2020-03-30 DIAGNOSIS — D500 Iron deficiency anemia secondary to blood loss (chronic): Secondary | ICD-10-CM

## 2020-03-30 DIAGNOSIS — N939 Abnormal uterine and vaginal bleeding, unspecified: Secondary | ICD-10-CM

## 2020-03-30 DIAGNOSIS — N898 Other specified noninflammatory disorders of vagina: Secondary | ICD-10-CM | POA: Insufficient documentation

## 2020-03-30 DIAGNOSIS — Z720 Tobacco use: Secondary | ICD-10-CM

## 2020-03-30 DIAGNOSIS — I10 Essential (primary) hypertension: Secondary | ICD-10-CM

## 2020-03-30 NOTE — Progress Notes (Addendum)
GYN presents for AEX/PAP/STD screening.  C/o lower abdominal pain 8/10 x 2 weeks, AUB 11/05/19 - 03/27/20.  Denies dysuria.  Mammogram scheduled for 04/20/2020.

## 2020-03-30 NOTE — Progress Notes (Signed)
Subjective:        Krista [name] is a 45 y.o. female here for a routine exam.  Current complaints: Prolonged and heavy periods and pelvic pain.    Personal health questionnaire:  Is patient Ashkenazi Jewish, have a family history of breast and/or ovarian cancer: no Is there a family history of uterine cancer diagnosed at age < 93, gastrointestinal cancer, urinary tract cancer, family member who is a Field seismologist syndrome-associated carrier: no Is the patient overweight and hypertensive, family history of diabetes, personal history of gestational diabetes, preeclampsia or PCOS: no Is patient over 31, have PCOS,  family history of premature CHD under age 20, diabetes, smoke, have hypertension or peripheral artery disease:  no At any time, has a partner hit, kicked or otherwise hurt or frightened you?: no Over the past 2 weeks, have you felt down, depressed or hopeless?: no Over the past 2 weeks, have you felt little interest or pleasure in doing things?:no   Gynecologic History Patient's last menstrual period was 11/05/2019 (exact date). Contraception: none Last Pap: 2019. Results were: normal Last mammogram: unknown . Results were: normal  Obstetric History OB History  Gravida Para Term Preterm AB Living  2 1 1     1   SAB IAB Ectopic Multiple Live Births          1    # Outcome Date GA Lbr Len/2nd Weight Sex Delivery Anes PTL Lv  2 Term 07/09/07     Vag-Spont   LIV  1 Gravida             Past Medical History:  Diagnosis Date  . Migraine     History reviewed. No pertinent surgical history.   Current Outpatient Medications:  .  amLODipine (NORVASC) 5 MG tablet, Take 1 tablet (5 mg total) by mouth daily., Disp: 30 tablet, Rfl: 11 .  ferrous sulfate 325 (65 FE) MG tablet, Take 1 tablet (325 mg total) by mouth every other day., Disp: 60 tablet, Rfl: 5 .  megestrol (MEGACE) 40 MG tablet, Take 1 tablet (40 mg total) by mouth 2 (two) times daily., Disp: 60 tablet, Rfl: 1 .  Prenat-Fe  Poly-Methfol-FA-DHA (VITAFOL ULTRA) 29-0.6-0.4-200 MG CAPS, Take 1 capsule by mouth daily before breakfast., Disp: 90 capsule, Rfl: 4 .  triamterene-hydrochlorothiazide (DYAZIDE) 37.5-25 MG capsule, Take 1 each (1 capsule total) by mouth daily., Disp: 30 capsule, Rfl: 11 No Known Allergies  Social History   Tobacco Use  . Smoking status: Current Some Day Smoker    Types: Cigarettes  . Smokeless tobacco: Never Used  Substance Use Topics  . Alcohol use: No    Family History  Problem Relation Age of Onset  . Cancer Mother   . Diabetes Mother   . Hypertension Mother   . Hypertension Father       Review of Systems  Constitutional: negative for fatigue and weight loss Respiratory: negative for cough and wheezing Cardiovascular: negative for chest pain, fatigue and palpitations Gastrointestinal: negative for abdominal pain and change in bowel habits Musculoskeletal:negative for myalgias Neurological: negative for gait problems and tremors Behavioral/Psych: negative for abusive relationship, depression Endocrine: negative for temperature intolerance    Genitourinary: positive for abnormal menstrual periods and pelvic pain.  Negative for genital lesions, hot flashes, sexual problems and vaginal discharge Integument/breast: negative for breast lump, breast tenderness, nipple discharge and skin lesion(s)    Objective:       BP (!) 161/107 (BP Location: Left Arm, Cuff Size: Normal)  Pulse 66   Ht 5\' 9"  (1.753 m)   Wt 151 lb (68.5 kg)   LMP 11/05/2019 (Exact Date) Comment: 11/05/19-03/27/20  BMI 22.30 kg/m  General:   alert and no distress  Skin:   no rash or abnormalities  Lungs:   clear to auscultation bilaterally  Heart:   regular rate and rhythm, S1, S2 normal, no murmur, click, rub or gallop  Breasts:   normal without suspicious masses, skin or nipple changes or axillary nodes  Abdomen:  normal findings: no organomegaly, soft, non-tender and no hernia  Pelvis:  External  genitalia: normal general appearance Urinary system: urethral meatus normal and bladder without fullness, nontender Vaginal: normal without tenderness, induration or masses Cervix: normal appearance Adnexa: normal bimanual exam Uterus: anteverted and non-tender, normal size   Lab Review Urine pregnancy test Labs reviewed yes Radiologic studies reviewed yes  I have spent a total of 20 minutes of face-to-face time, excluding clinical staff time, reviewing notes and preparing to see patient, ordering tests and/or medications, and counseling the patient.  Assessment:     1. Encounter for gynecological examination with Papanicolaou smear of cervix Rx: - Cytology - PAP( [facility])  2. Vaginal discharge Rx: - Cervicovaginal ancillary only( [facility])  3. Abnormal uterine bleeding (AUB) - ultrasound WNL's except for a few small intramural fibroids - unresponsive to Megace - follow up in 2 weeks for endometrial biopsy - management options discussed, including Endometrial Ablation  4. Iron deficiency anemia due to chronic blood loss - taking iron / vitamins Rx: - CBC - Ferritin  5. HTN (hypertension), benign Rx: - Ambulatory referral to Internal Medicine  6. Migraine without aura and without status migrainosus, not intractable - clinically stable  7. Tobacco abuse - cessation with the aid of medication and behavioral modification recommended    Plan:    Education reviewed: calcium supplements, depression evaluation, low fat, low cholesterol diet, safe sex/STD prevention, self breast exams, smoking cessation and weight bearing exercise. Follow up in: 2 weeks.  Endometrial Biopsy    Shelly Bombard, MD 03/30/2020 9:16 AM

## 2020-03-30 NOTE — Patient Instructions (Addendum)
Abnormal Uterine Bleeding Abnormal uterine bleeding means bleeding more than usual from your womb (uterus). It can include:  Bleeding between menstrual periods.  Bleeding after sex.  Bleeding that is heavier than normal.  Menstrual periods that last longer than usual.  Bleeding after you have stopped having your menstrual period (menopause). There are many problems that may cause this. You should see a doctor for any kind of bleeding that is not normal. Treatment depends on the cause of the bleeding. Follow these instructions at home: Medicines  Take over-the-counter and prescription medicines only as told by your doctor.  Tell your doctor about other medicines that you take. ? If told by your doctor, stop taking aspirin or medicines that have aspirin in them. These medicines can make you bleed more.  You may be given iron pills to replace iron that your body loses because of this condition. Take them as told by your doctor. Managing constipation If you are taking iron pills, you may have trouble pooping (constipation). To prevent or treat trouble pooping, you may need to:  Drink enough fluid to keep your pee (urine) pale yellow.  Take over-the-counter or prescription medicines.  Eat foods that are high in fiber. These include beans, whole grains, and fresh fruits and vegetables.  Limit foods that are high in fat and sugar. These include fried or sweet foods. General instructions  Watch your condition for any changes.  Do not use tampons, douche, or have sex, if your doctor tells you not to.  Change your pads often.  Get regular exams. This includes pelvic exams and cervical cancer screenings. ? It is up to you to get the results of any tests that are done. Ask your doctor, or the department that is doing the tests, when your results will be ready.  Keep all follow-up visits as told by your doctor. This is important. Contact a doctor if:  The bleeding lasts more than 1  week.  You feel dizzy at times.  You feel like you may vomit (nausea).  You vomit.  You feel light-headed or weak.  Your symptoms get worse. Get help right away if:  You pass out.  You have to change pads every hour.  You have pain in your belly.  You have a fever or chills.  You get sweaty.  You get weak.  You pass large blood clots from your vagina. Summary  Abnormal uterine bleeding means bleeding more than usual from your womb (uterus).  Any kind of bleeding that is not normal should be checked by a doctor.  Treatment depends on the cause of the bleeding.  Get help right away if you pass out, you have to change pads every hour, or you pass large blood clots from your vagina. This information is not intended to replace advice given to you by your health care provider. Make sure you discuss any questions you have with your health care provider. Document Revised: 10/22/2018 Document Reviewed: 10/22/2018 Elsevier Patient Education  [place].  [place] medicine (25th ed., pp. [phone]). [place], [place]: Elsevier.">  Anemia  Anemia is a condition in which there is not enough red blood cells or hemoglobin in the blood. Hemoglobin is a substance in red blood cells that carries oxygen. When you do not have enough red blood cells or hemoglobin (are anemic), your body cannot get enough oxygen and your organs may not work properly. As a result, you may feel very tired or have other problems. What are the causes?  Common causes of anemia include:  Excessive bleeding. Anemia can be caused by excessive bleeding inside or outside the body, including bleeding from the intestines or from heavy menstrual periods in females.  Poor nutrition.  Long-lasting (chronic) kidney, thyroid, and liver disease.  Bone marrow disorders, spleen problems, and blood disorders.  Cancer and treatments for cancer.  HIV (human immunodeficiency virus) and AIDS (acquired  immunodeficiency syndrome).  Infections, medicines, and autoimmune disorders that destroy red blood cells. What are the signs or symptoms? Symptoms of this condition include:  Minor weakness.  Dizziness.  Headache, or difficulties concentrating and sleeping.  Heartbeats that feel irregular or faster than normal (palpitations).  Shortness of breath, especially with exercise.  Pale skin, lips, and nails, or cold hands and feet.  Indigestion and nausea. Symptoms may occur suddenly or develop slowly. If your anemia is mild, you may not have symptoms. How is this diagnosed? This condition is diagnosed based on blood tests, your medical history, and a physical exam. In some cases, a test may be needed in which cells are removed from the soft tissue inside of a bone and looked at under a microscope (bone marrow biopsy). Your health care provider may also check your stool (feces) for blood and may do additional testing to look for the cause of your bleeding. Other tests may include:  Imaging tests, such as a CT scan or MRI.  A procedure to see inside your esophagus and stomach (endoscopy).  A procedure to see inside your colon and rectum (colonoscopy). How is this treated? Treatment for this condition depends on the cause. If you continue to lose a lot of blood, you may need to be treated at a hospital. Treatment may include:  Taking supplements of iron, vitamin U20, or folic acid.  Taking a hormone medicine (erythropoietin) that can help to stimulate red blood cell growth.  Having a blood transfusion. This may be needed if you lose a lot of blood.  Making changes to your diet.  Having surgery to remove your spleen. Follow these instructions at home:  Take over-the-counter and prescription medicines only as told by your health care provider.  Take supplements only as told by your health care provider.  Follow any diet instructions that you were given by your health care  provider.  Keep all follow-up visits as told by your health care provider. This is important. Contact a health care provider if:  You develop new bleeding anywhere in the body. Get help right away if:  You are very weak.  You are short of breath.  You have pain in your abdomen or chest.  You are dizzy or feel faint.  You have trouble concentrating.  You have bloody stools, black stools, or tarry stools.  You vomit repeatedly or you vomit up blood. These symptoms may represent a serious problem that is an emergency. Do not wait to see if the symptoms will go away. Get medical help right away. Call your local emergency services (911 in the [place] Do not drive yourself to the hospital. Summary  Anemia is a condition in which you do not have enough red blood cells or enough of a substance in your red blood cells that carries oxygen (hemoglobin).  Symptoms may occur suddenly or develop slowly.  If your anemia is mild, you may not have symptoms.  This condition is diagnosed with blood tests, a medical history, and a physical exam. Other tests may be needed.  Treatment for this condition depends on the  cause of the anemia. This information is not intended to replace advice given to you by your health care provider. Make sure you discuss any questions you have with your health care provider. Document Revised: 11/26/2018 Document Reviewed: 11/26/2018 Elsevier Patient Education  2021 [place].  Endometrial Biopsy  An endometrial biopsy is a procedure to remove tissue samples from the endometrium, which is the lining of the uterus. The tissue that is removed can then be checked under a microscope for disease. This procedure is used to diagnose conditions such as endometrial cancer, endometrial tuberculosis, polyps, or other inflammatory conditions. This procedure may also be used to investigate uterine bleeding to determine where you are in your menstrual cycle or how your hormone  levels are affecting the lining of the uterus. Tell a health care provider about:  Any allergies you have.  All medicines you are taking, including vitamins, herbs, eye drops, creams, and over-the-counter medicines.  Any problems you or family members have had with anesthetic medicines.  Any blood disorders you have.  Any surgeries you have had.  Any medical conditions you have.  Whether you are pregnant or may be pregnant. What are the risks? Generally, this is a safe procedure. However, problems may occur, including:  Bleeding.  Pelvic infection.  Puncture of the wall of the uterus with the biopsy device (rare).  Allergic reactions to medicines. What happens before the procedure?  Keep a record of your menstrual cycles as told by your health care provider. You may need to schedule your procedure for a specific time in your cycle.  You may want to bring a sanitary pad to wear after the procedure.  Plan to have someone take you home from the hospital or clinic.  Ask your health care provider about: ? Changing or stopping your regular medicines. This is especially important if you are taking diabetes medicines, arthritis medicines, or blood thinners. ? Taking medicines such as aspirin and ibuprofen. These medicines can thin your blood. Do not take these medicines unless your health care provider tells you to take them. ? Taking over-the-counter medicines, vitamins, herbs, and supplements. What happens during the procedure?  You will lie on an exam table with your feet and legs supported as in a pelvic exam.  Your health care provider will insert an instrument (speculum) into your vagina to see your cervix.  Your cervix will be cleansed with an antiseptic solution.  A medicine (local anesthetic) will be used to numb the cervix.  A forceps instrument (tenaculum) will be used to hold your cervix steady for the biopsy.  A thin, rod-like instrument (uterine sound) will be  inserted through your cervix to determine the length of your uterus and the location where the biopsy sample will be removed.  A thin, flexible tube (catheter) will be inserted through your cervix and into the uterus. The catheter will be used to collect the biopsy sample from your endometrial tissue.  The catheter and speculum will then be removed, and the tissue sample will be sent to a lab for examination. The procedure may vary among health care providers and hospitals. What can I expect after procedure?  You will rest in a recovery area until you are ready to go home.  You may have mild cramping and a small amount of vaginal bleeding. This is normal.  You may have a small amount of vaginal bleeding for a few days. This is normal.  It is up to you to get the results of  your procedure. Ask your health care provider, or the department that is doing the procedure, when your results will be ready. Follow these instructions at home:  Take over-the-counter and prescription medicines only as told by your health care provider.  Do not douche, use tampons, or have sexual intercourse until your health care provider approves.  Return to your normal activities as told by your health care provider. Ask your health care provider what activities are safe for you.  Follow instructions from your health care provider about any activity restrictions, such as restrictions on strenuous exercise or heavy lifting.  Keep all follow-up visits. This is important. Contact a health care provider:  You have heavy bleeding, or bleed for longer than 2 days after the procedure.  You have bad smelling discharge from your vagina.  You have a fever or chills.  You have a burning sensation when urinating or you have difficulty urinating.  You have severe pain in your lower abdomen. Get help right away if you:  You have severe cramps in your stomach or back.  You pass large blood clots.  Your bleeding  increases.  You become weak or light-headed, or you faint or lose consciousness. Summary  An endometrial biopsy is a procedure to remove tissue samples is taken from the endometrium, which is the lining of the uterus.  The tissue sample that is removed will be checked under a microscope for disease.  This procedure is used to diagnose conditions such as endometrial cancer, endometrial tuberculosis, polyps, or other inflammatory conditions.  After the procedure, it is common to have mild cramping and a small amount of vaginal bleeding for a few days.  Do not douche, use tampons, or have sexual intercourse until your health care provider approves. Ask your health care provider which activities are safe for you. This information is not intended to replace advice given to you by your health care provider. Make sure you discuss any questions you have with your health care provider. Document Revised: 07/14/2019 Document Reviewed: 07/14/2019 Elsevier Patient Education  2021 [place].  Endometrial Ablation Endometrial ablation is a procedure that destroys the thin inner layer of the lining of the uterus (endometrium). This procedure may be done:  To stop heavy menstrual periods.  To stop bleeding that is causing anemia.  To control irregular bleeding.  To treat bleeding caused by small tumors (fibroids) in the endometrium. This procedure is often done as an alternative to major surgery, such as removal of the uterus and cervix (hysterectomy). As a result of this procedure:  You may not be able to have children. However, if you have not yet gone through menopause: ? You may still have a small chance of getting pregnant. ? You will need to use a reliable method of birth control after the procedure to prevent pregnancy.  You may stop having a menstrual period, or you may have only a small amount of bleeding during your period. Menstruation may return several years after the  procedure. Tell a health care provider about:  Any allergies you have.  All medicines you are taking, including vitamins, herbs, eye drops, creams, and over-the-counter medicines.  Any problems you or family members have had with the use of anesthetic medicines.  Any blood disorders you have.  Any surgeries you have had.  Any medical conditions you have.  Whether you are pregnant or may be pregnant. What are the risks? Generally, this is a safe procedure. However, problems may occur, including:  A  hole (perforation) in the uterus or bowel.  Infection in the uterus, bladder, or vagina.  Bleeding.  Allergic reaction to medicines.  Damage to nearby structures or organs.  An air bubble in the lung (air embolus).  Problems with pregnancy.  Failure of the procedure.  Decreased ability to diagnose cancer in the endometrium. Scar tissue forms after the procedure, making it more difficult to get a sample of the uterine lining. What happens before the procedure? Medicines Ask your health care provider about:  Changing or stopping your regular medicines. This is especially important if you take diabetes medicines or blood thinners.  Taking medicines such as aspirin and ibuprofen. These medicines can thin your blood. Do not take these medicines before your procedure if your doctor tells you not to take them.  Taking over-the-counter medicines, vitamins, herbs, and supplements. Tests  You will have tests of your endometrium to make sure there are no precancerous cells or cancer cells present.  You may have an ultrasound of the uterus. General instructions  Do not use any products that contain nicotine or tobacco for at least 4 weeks before the procedure. These include cigarettes, chewing tobacco, and vaping devices, such as e-cigarettes. If you need help quitting, ask your health care provider.  You may be given medicines to thin the endometrium.  Ask your health care  provider what steps will be taken to help prevent infection. These steps may include: ? Removing hair at the surgery site. ? Washing skin with a germ-killing soap. ? Taking antibiotic medicine.  Plan to have a responsible adult take you home from the hospital or clinic.  Plan to have a responsible adult care for you for the time you are told after you leave the hospital or clinic. This is important. What happens during the procedure?  You will lie on an exam table with your feet and legs supported as in a pelvic exam.  An IV will be inserted into one of your veins.  You will be given a medicine to help you relax (sedative).  A surgical tool with a light and camera (resectoscope) will be inserted into your vagina and moved into your uterus. This allows your surgeon to see inside your uterus.  Endometrial tissue will be destroyed and removed, using one of the following methods: ? Radiofrequency. This uses an electrical current to destroy the endometrium. ? Cryotherapy. This uses extreme cold to freeze the endometrium. ? Heated fluid. This uses a heated salt and water (saline) solution to destroy the endometrium. ? Microwave. This uses high-energy microwaves to heat up the endometrium and destroy it. ? Thermal balloon. This involves inserting a catheter with a balloon tip into the uterus. The balloon tip is filled with heated fluid to destroy the endometrium. The procedure may vary among health care providers and hospitals.   What happens after the procedure?  Your blood pressure, heart rate, breathing rate, and blood oxygen level will be monitored until you leave the hospital or clinic.  You may have vaginal bleeding for 4-6 weeks after the procedure. You may also have: ? Cramps. ? A thin, watery vaginal discharge that is light pink or brown. ? A need to urinate more than usual. ? Nausea.  If you were given a sedative during the procedure, it can affect you for several hours. Do not  drive or operate machinery until your health care provider says that it is safe.  Do not have sex or insert anything into your vagina until your  health care provider says it is safe. Summary  Endometrial ablation is done to treat many causes of heavy menstrual bleeding. The procedure destroys the thin inner layer of the lining of the uterus (endometrium).  This procedure is often done as an alternative to major surgery, such as removal of the uterus and cervix (hysterectomy).  Plan to have a responsible adult take you home from the hospital or clinic. This information is not intended to replace advice given to you by your health care provider. Make sure you discuss any questions you have with your health care provider. Document Revised: 07/10/2019 Document Reviewed: 07/10/2019 Elsevier Patient Education  [place].

## 2020-03-31 ENCOUNTER — Other Ambulatory Visit: Payer: Self-pay | Admitting: Obstetrics

## 2020-03-31 DIAGNOSIS — B373 Candidiasis of vulva and vagina: Secondary | ICD-10-CM

## 2020-03-31 DIAGNOSIS — B9689 Other specified bacterial agents as the cause of diseases classified elsewhere: Secondary | ICD-10-CM

## 2020-03-31 DIAGNOSIS — B3731 Acute candidiasis of vulva and vagina: Secondary | ICD-10-CM

## 2020-03-31 LAB — CERVICOVAGINAL ANCILLARY ONLY
Bacterial Vaginitis (gardnerella): POSITIVE — AB
Candida Glabrata: NEGATIVE
Candida Vaginitis: POSITIVE — AB
Chlamydia: NEGATIVE
Comment: NEGATIVE
Comment: NEGATIVE
Comment: NEGATIVE
Comment: NEGATIVE
Comment: NEGATIVE
Comment: NORMAL
Neisseria Gonorrhea: NEGATIVE
Trichomonas: NEGATIVE

## 2020-03-31 LAB — CBC
Hematocrit: 35.3 % (ref 34.0–46.6)
Hemoglobin: 11.4 g/dL (ref 11.1–15.9)
MCH: 28.1 pg (ref 26.6–33.0)
MCHC: 32.3 g/dL (ref 31.5–35.7)
MCV: 87 fL (ref 79–97)
Platelets: 292 10*3/uL (ref 150–450)
RBC: 4.05 x10E6/uL (ref 3.77–5.28)
RDW: 13.6 % (ref 11.7–15.4)
WBC: 4.2 10*3/uL (ref 3.4–10.8)

## 2020-03-31 LAB — FERRITIN: Ferritin: 9 ng/mL — ABNORMAL LOW (ref 15–150)

## 2020-03-31 LAB — CYTOLOGY - PAP
Comment: NEGATIVE
Diagnosis: NEGATIVE
High risk HPV: NEGATIVE

## 2020-03-31 MED ORDER — METRONIDAZOLE 500 MG PO TABS: 500.0000 mg | ORAL_TABLET | Freq: Two times a day (BID) | ORAL | 2 refills | Status: DC

## 2020-03-31 MED ORDER — FLUCONAZOLE 150 MG PO TABS
150.0000 mg | ORAL_TABLET | Freq: Once | ORAL | 0 refills | Status: AC
Start: 2020-03-31 — End: 2020-03-31

## 2020-04-01 ENCOUNTER — Other Ambulatory Visit: Payer: Self-pay

## 2020-04-01 ENCOUNTER — Ambulatory Visit (INDEPENDENT_AMBULATORY_CARE_PROVIDER_SITE_OTHER): Payer: Self-pay | Admitting: Nurse Practitioner

## 2020-04-01 ENCOUNTER — Encounter: Payer: Self-pay | Admitting: Nurse Practitioner

## 2020-04-01 VITALS — BP 136/99 | HR 79 | Temp 97.9°F | Ht 69.0 in | Wt 157.0 lb

## 2020-04-01 DIAGNOSIS — Z13220 Encounter for screening for lipoid disorders: Secondary | ICD-10-CM

## 2020-04-01 DIAGNOSIS — I10 Essential (primary) hypertension: Secondary | ICD-10-CM

## 2020-04-01 DIAGNOSIS — Z7689 Persons encountering health services in other specified circumstances: Secondary | ICD-10-CM

## 2020-04-01 LAB — POCT URINALYSIS DIPSTICK
Bilirubin, UA: NEGATIVE
Clarity, UA: NEGATIVE
Glucose, UA: NEGATIVE
Ketones, UA: NEGATIVE
Leukocytes, UA: NEGATIVE
Nitrite, UA: NEGATIVE
Protein, UA: NEGATIVE
Spec Grav, UA: 1.02 (ref 1.010–1.025)
Urobilinogen, UA: 0.2 E.U./dL
pH, UA: 8.5 — AB (ref 5.0–8.0)

## 2020-04-01 MED ORDER — AMLODIPINE BESYLATE 10 MG PO TABS: 10.0000 mg | ORAL_TABLET | Freq: Every day | ORAL | 3 refills | Status: DC

## 2020-04-01 MED ORDER — TRIAMTERENE-HCTZ 37.5-25 MG PO CAPS: 1.0000 | ORAL_CAPSULE | Freq: Every day | ORAL | 3 refills | Status: DC

## 2020-04-01 MED ORDER — CLONIDINE HCL 0.1 MG PO TABS: 0.2000 mg | ORAL_TABLET | Freq: Once | ORAL | Status: DC

## 2020-04-01 NOTE — Progress Notes (Signed)
[facility] [place], [place]  [address] Phone:  [phone]   Fax:  [phone]   New Patient Office Visit  Subjective:  Patient ID: Krista [name], female    DOB: Aug 12, 1975  Age: 45 y.o. MRN: [id number]  CC:  Chief Complaint  Patient presents with  . New Patient (Initial Visit)    Est care ,htn     HPI Vale Chiles presents to establish care. She  has a past medical history of Hypertension and Migraine.   Hypertension Patient is here for evaluation of elevated blood pressures. Age at onset of elevated blood pressure: unsure. She has recently started on BP medication at [facility] office. Cardiac symptoms: Headache. Patient denies chest pain, chest pressure/discomfort, claudication, dyspnea, exertional chest pressure/discomfort, fatigue, irregular heart beat, near-syncope, orthopnea, palpitations, paroxysmal nocturnal dyspnea, syncope and tachypnea. Cardiovascular risk factors: hypertension and sedentary lifestyle. Use of agents associated with hypertension: none. History of target organ damage: none.  Past Medical History:  Diagnosis Date  . Hypertension   . Migraine     History reviewed. No pertinent surgical history.  Family History  Problem Relation Age of Onset  . Cancer Mother   . Diabetes Mother   . Hypertension Mother   . Hypertension Father     Social History   Socioeconomic History  . Marital status: Single    Spouse name: Not on file  . Number of children: Not on file  . Years of education: Not on file  . Highest education level: Not on file  Occupational History  . Not on file  Tobacco Use  . Smoking status: Former Smoker    Types: Cigarettes  . Smokeless tobacco: Never Used  Vaping Use  . Vaping Use: Never used  Substance and Sexual Activity  . Alcohol use: No  . Drug use: No  . Sexual activity: Not Currently    Partners: Male    Birth control/protection: None  Other Topics Concern  . Not on file  Social History  Narrative  . Not on file   Social Determinants of Health   Financial Resource Strain: Not on file  Food Insecurity: Not on file  Transportation Needs: Not on file  Physical Activity: Not on file  Stress: Not on file  Social Connections: Not on file  Intimate Partner Violence: Not on file    ROS Review of Systems  Respiratory: Negative for chest tightness and shortness of breath.   Cardiovascular: Negative for chest pain.  Musculoskeletal:       Left leg cramps at night   Neurological: Positive for headaches. Negative for dizziness.    Objective:   Today's Vitals: BP (!) 136/99   Pulse 79   Temp 97.9 F (36.6 C) (Temporal)   Ht 5\' 9"  (1.753 m)   Wt 157 lb (71.2 kg)   LMP 03/27/2020   SpO2 98%   BMI 23.18 kg/m   Physical Exam Constitutional:      Appearance: She is normal weight.  HENT:     Head: Normocephalic and atraumatic.     Right Ear: Tympanic membrane normal.     Left Ear: Tympanic membrane normal.     Nose: Nose normal.     Mouth/Throat:     Mouth: Mucous membranes are moist.  Cardiovascular:     Rate and Rhythm: Normal rate and regular rhythm.     Pulses: Normal pulses.     Heart sounds: Normal heart sounds.  Pulmonary:  Effort: Pulmonary effort is normal.     Breath sounds: Normal breath sounds.  Abdominal:     General: Abdomen is flat. Bowel sounds are normal.     Palpations: Abdomen is soft.  Genitourinary:    General: Normal vulva.  Musculoskeletal:        General: Normal range of motion.     Cervical back: Normal range of motion.     Right lower leg: No edema.     Left lower leg: No edema.  Skin:    General: Skin is warm and dry.     Capillary Refill: Capillary refill takes less than 2 seconds.  Neurological:     General: No focal deficit present.     Mental Status: She is alert and oriented to person, place, and time.  Psychiatric:        Mood and Affect: Mood normal.        Thought Content: Thought content normal.         Judgment: Judgment normal.     Assessment & Plan:   Problem List Items Addressed This Visit   None   Visit Diagnoses    Hypertension, unspecified type    -  Primary Persistent increased amlodipine 10 mg daily We will continue with triamterene/hydrochlorothiazide 37.5/25 mg   Relevant Medications   amLODipine (NORVASC) 10 MG tablet   triamterene-hydrochlorothiazide (DYAZIDE) 37.5-25 MG capsule   Other Relevant Orders   POCT Urinalysis Dipstick   Encounter to establish care   Discussed female health maintenance; SBE, annual CBE, PAP test Discussed general safety in vehicle and COVID Discussed regular hydration with water Discussed healthy diet and exercise and weight management Discussed sexual health  Discussed mental health Encouraged to call our office for an appointment with in ongoing concerns for questions.         Outpatient Encounter Medications as of 04/01/2020  Medication Sig  . amLODipine (NORVASC) 10 MG tablet Take 1 tablet (10 mg total) by mouth daily.  . ferrous sulfate 325 (65 FE) MG tablet Take 1 tablet (325 mg total) by mouth every other day.  . megestrol (MEGACE) 40 MG tablet Take 1 tablet (40 mg total) by mouth 2 (two) times daily.  . Prenat-Fe Poly-Methfol-FA-DHA (VITAFOL ULTRA) 29-0.6-0.4-200 MG CAPS Take 1 capsule by mouth daily before breakfast.  . [DISCONTINUED] amLODipine (NORVASC) 5 MG tablet Take 1 tablet (5 mg total) by mouth daily.  . metroNIDAZOLE (FLAGYL) 500 MG tablet Take 1 tablet (500 mg total) by mouth 2 (two) times daily. (Patient not taking: Reported on 04/01/2020)  . triamterene-hydrochlorothiazide (DYAZIDE) 37.5-25 MG capsule Take 1 each (1 capsule total) by mouth daily.  . [DISCONTINUED] triamterene-hydrochlorothiazide (DYAZIDE) 37.5-25 MG capsule Take 1 each (1 capsule total) by mouth daily. (Patient not taking: Reported on 04/01/2020)   Facility-Administered Encounter Medications as of 04/01/2020  Medication  . cloNIDine (CATAPRES) tablet  0.2 mg    Follow-up: No follow-ups on file.   Vevelyn Francois, NP

## 2020-04-02 LAB — LIPID PANEL
Chol/HDL Ratio: 3.8 ratio (ref 0.0–4.4)
Cholesterol, Total: 150 mg/dL (ref 100–199)
HDL: 39 mg/dL — ABNORMAL LOW (ref >39)
LDL Chol Calc (NIH): 102 mg/dL — ABNORMAL HIGH (ref 0–99)
Triglycerides: 41 mg/dL (ref 0–149)
VLDL Cholesterol Cal: 9 mg/dL (ref 5–40)

## 2020-04-04 ENCOUNTER — Encounter: Payer: Self-pay | Admitting: Nurse Practitioner

## 2020-04-06 ENCOUNTER — Telehealth: Payer: Self-pay

## 2020-04-06 NOTE — Telephone Encounter (Signed)
Patient informed of results and recommendations

## 2020-04-06 NOTE — Telephone Encounter (Signed)
-----   Message from Vevelyn Francois, NP sent at 04/05/2020  4:20 PM EDT ----- Overall labs we will stable no change in treatment

## 2020-04-15 ENCOUNTER — Other Ambulatory Visit: Payer: Medicaid Other | Admitting: Obstetrics

## 2020-04-28 ENCOUNTER — Encounter (HOSPITAL_COMMUNITY): Payer: Self-pay

## 2020-04-28 ENCOUNTER — Emergency Department (HOSPITAL_BASED_OUTPATIENT_CLINIC_OR_DEPARTMENT_OTHER)
Admit: 2020-04-28 | Discharge: 2020-04-28 | Disposition: A | Discharge status: Home / Self Care | Payer: Self-pay | Attending: Emergency Medicine | Admitting: Emergency Medicine

## 2020-04-28 ENCOUNTER — Ambulatory Visit (INDEPENDENT_AMBULATORY_CARE_PROVIDER_SITE_OTHER): Payer: Self-pay | Admitting: Obstetrics

## 2020-04-28 ENCOUNTER — Emergency Department (HOSPITAL_COMMUNITY)
Admission: EM | Admit: 2020-04-28 | Discharge: 2020-04-28 | Disposition: A | Discharge status: Home / Self Care | Payer: Self-pay | Attending: Emergency Medicine | Admitting: Emergency Medicine

## 2020-04-28 ENCOUNTER — Other Ambulatory Visit: Payer: Self-pay

## 2020-04-28 VITALS — BP 162/120 | HR 69 | Wt 156.0 lb

## 2020-04-28 DIAGNOSIS — M7989 Other specified soft tissue disorders: Secondary | ICD-10-CM

## 2020-04-28 DIAGNOSIS — D72819 Decreased white blood cell count, unspecified: Secondary | ICD-10-CM | POA: Insufficient documentation

## 2020-04-28 DIAGNOSIS — I10 Essential (primary) hypertension: Secondary | ICD-10-CM | POA: Insufficient documentation

## 2020-04-28 DIAGNOSIS — R2242 Localized swelling, mass and lump, left lower limb: Secondary | ICD-10-CM | POA: Insufficient documentation

## 2020-04-28 DIAGNOSIS — Z79899 Other long term (current) drug therapy: Secondary | ICD-10-CM | POA: Insufficient documentation

## 2020-04-28 DIAGNOSIS — M79605 Pain in left leg: Secondary | ICD-10-CM

## 2020-04-28 DIAGNOSIS — N939 Abnormal uterine and vaginal bleeding, unspecified: Secondary | ICD-10-CM

## 2020-04-28 DIAGNOSIS — M5432 Sciatica, left side: Secondary | ICD-10-CM | POA: Insufficient documentation

## 2020-04-28 DIAGNOSIS — Z87891 Personal history of nicotine dependence: Secondary | ICD-10-CM | POA: Insufficient documentation

## 2020-04-28 LAB — CBC WITH DIFFERENTIAL/PLATELET
Abs Immature Granulocytes: 0.01 10*3/uL (ref 0.00–0.07)
Basophils Absolute: 0 10*3/uL (ref 0.0–0.1)
Basophils Relative: 1 %
Eosinophils Absolute: 0.1 10*3/uL (ref 0.0–0.5)
Eosinophils Relative: 2 %
HCT: 38.9 % (ref 36.0–46.0)
Hemoglobin: 12.3 g/dL (ref 12.0–15.0)
Immature Granulocytes: 0 %
Lymphocytes Relative: 41 %
Lymphs Abs: 1.5 10*3/uL (ref 0.7–4.0)
MCH: 27.9 pg (ref 26.0–34.0)
MCHC: 31.6 g/dL (ref 30.0–36.0)
MCV: 88.2 fL (ref 80.0–100.0)
Monocytes Absolute: 0.5 10*3/uL (ref 0.1–1.0)
Monocytes Relative: 13 %
Neutro Abs: 1.6 10*3/uL — ABNORMAL LOW (ref 1.7–7.7)
Neutrophils Relative %: 43 %
Platelets: 311 10*3/uL (ref 150–400)
RBC: 4.41 MIL/uL (ref 3.87–5.11)
RDW: 15.1 % (ref 11.5–15.5)
WBC: 3.7 10*3/uL — ABNORMAL LOW (ref 4.0–10.5)
nRBC: 0 % (ref 0.0–0.2)

## 2020-04-28 LAB — BASIC METABOLIC PANEL
Anion gap: 8 (ref 5–15)
BUN: 11 mg/dL (ref 6–20)
CO2: 22 mmol/L (ref 22–32)
Calcium: 9.4 mg/dL (ref 8.9–10.3)
Chloride: 110 mmol/L (ref 98–111)
Creatinine, Ser: 0.91 mg/dL (ref 0.44–1.00)
GFR, Estimated: >60 mL/min (ref >60)
Glucose, Bld: 84 mg/dL (ref 70–99)
Potassium: 3.8 mmol/L (ref 3.5–5.1)
Sodium: 140 mmol/L (ref 135–145)

## 2020-04-28 MED ORDER — NAPROXEN 500 MG PO TABS: 500.0000 mg | ORAL_TABLET | Freq: Two times a day (BID) | ORAL | 0 refills | Status: DC

## 2020-04-28 MED ORDER — OXYCODONE-ACETAMINOPHEN 5-325 MG PO TABS
1.0000 | ORAL_TABLET | Freq: Once | ORAL | Status: AC
  Administered 2020-04-28: 1 via ORAL
  Filled 2020-04-28: qty 1

## 2020-04-28 MED ORDER — METHYLPREDNISOLONE 4 MG PO TBPK: ORAL_TABLET | ORAL | 0 refills | Status: DC

## 2020-04-28 MED ORDER — METHOCARBAMOL 500 MG PO TABS: 500.0000 mg | ORAL_TABLET | Freq: Two times a day (BID) | ORAL | 0 refills | Status: DC

## 2020-04-28 NOTE — Progress Notes (Signed)
Left lower extremity venous duplex has been completed. Preliminary results can be found in CV Proc through chart review.  Results were given to Benedetto Goad PA.  04/28/20 12:02 PM Carlos Levering RVT

## 2020-04-28 NOTE — Progress Notes (Signed)
Patient rescheduled and sent to the ER because severely elevated BP and severe leg pain.  Shelly Bombard, MD 04/28/2020 10:52 AM

## 2020-04-28 NOTE — ED Triage Notes (Signed)
Pt arrived via POV, left leg swelling x2.5 months. Denies swelling in right, denies any trauma to any area. Hx of HTN, has not taken her meds today.

## 2020-04-28 NOTE — Progress Notes (Signed)
Pt states she is still bleeding.

## 2020-04-28 NOTE — Discharge Instructions (Addendum)
We do not see signs of a blood clot today and your lab work looks good.  Suspect your pain may be due to sciatica.  Use exercises provided to help with pain.  Take naproxen twice daily, Robaxin as needed for muscle spasm and follow directions on Medrol Dosepak.  Make sure you take naproxen and Medrol (steroid) with food on your stomach.  Please schedule follow-up appointment with your primary care doctor for further evaluation of this pain in about a week.  Return if you develop numbness, weakness, abdominal pain, fevers or any other new or concerning symptoms.

## 2020-04-28 NOTE — ED Provider Notes (Signed)
[facility] DEPT Provider Note   CSN: [id number] Arrival date & time: 04/28/20  1008     History Chief Complaint  Patient presents with  . Hypertension  . Leg Swelling    Krista [name] is a 45 y.o. female.  Krista [name] is a 45 y.o. female with a history of hypertension and migraines, who presents to the ED for left leg pain and swelling.  Reports symptoms started initially 2-1/2 months ago, at that time were intermittent, but have become more persistent.  Reports pain in the back of the leg described as a pulling sensation and tightness throughout the back of the leg.  She is also intermittently noted swelling in the leg.  No redness or skin changes.  No focal pain over the joint.  Denies any trauma or injury.  Also reports some left low back pain and that it is painful to put full pressure on the low back and buttock the left side when sitting.  She was seen at her OB/GYN for routine follow-up appointment and they recommended she come in for further evaluation.  Also noted hypertension, patient reports she has not taken her medications today.  Denies any chest pain or shortness of breath.  No swelling or pain of the right leg.  No numbness, tingling or weakness.        Past Medical History:  Diagnosis Date  . Hypertension   . Migraine     There are no problems to display for this patient.   History reviewed. No pertinent surgical history.   OB History    Gravida  2   Para  1   Term  1   Preterm      AB      Living  1     SAB      IAB      Ectopic      Multiple      Live Births  1           Family History  Problem Relation Age of Onset  . Cancer Mother   . Diabetes Mother   . Hypertension Mother   . Hypertension Father     Social History   Tobacco Use  . Smoking status: Former Smoker    Types: Cigarettes  . Smokeless tobacco: Never Used  Vaping Use  . Vaping Use: Never used  Substance Use Topics  . Alcohol  use: No  . Drug use: No    Home Medications Prior to Admission medications   Medication Sig Start Date End Date Taking? Authorizing Provider  amLODipine (NORVASC) 10 MG tablet Take 1 tablet (10 mg total) by mouth daily. 04/01/20 04/01/21  Vevelyn Francois, NP  ferrous sulfate 325 (65 FE) MG tablet Take 1 tablet (325 mg total) by mouth every other day. 02/24/20   Shelly Bombard, MD  megestrol (MEGACE) 40 MG tablet Take 1 tablet (40 mg total) by mouth 2 (two) times daily. 02/24/20   Shelly Bombard, MD  metroNIDAZOLE (FLAGYL) 500 MG tablet Take 1 tablet (500 mg total) by mouth 2 (two) times daily. Patient not taking: Reported on 04/01/2020 03/31/20   Shelly Bombard, MD  Prenat-Fe Poly-Methfol-FA-DHA (VITAFOL ULTRA) 29-0.6-0.4-200 MG CAPS Take 1 capsule by mouth daily before breakfast. 02/24/20   Shelly Bombard, MD  triamterene-hydrochlorothiazide (DYAZIDE) 37.5-25 MG capsule Take 1 each (1 capsule total) by mouth daily. 04/01/20 04/01/21  Vevelyn Francois, NP    Allergies  Patient has no known allergies.  Review of Systems   Review of Systems  Constitutional: Negative for chills and fever.  HENT: Negative.   Respiratory: Negative for shortness of breath.   Cardiovascular: Positive for leg swelling. Negative for chest pain.  Gastrointestinal: Negative for abdominal pain, constipation, diarrhea, nausea and vomiting.  Genitourinary: Negative for dysuria, flank pain, frequency and hematuria.  Musculoskeletal: Positive for back pain and myalgias. Negative for arthralgias, gait problem, joint swelling and neck pain.  Skin: Negative for color change, rash and wound.  Neurological: Negative for weakness and numbness.  All other systems reviewed and are negative.   Physical Exam Updated Vital Signs BP (!) 166/127 (BP Location: Left Arm)   Pulse 63   Temp 98 F (36.7 C) (Oral)   Resp 18   SpO2 100%   Physical Exam Vitals and nursing note reviewed.  Constitutional:      General: She  is not in acute distress.    Appearance: Normal appearance. She is well-developed and normal weight. She is not ill-appearing or diaphoretic.  HENT:     Head: Normocephalic and atraumatic.  Eyes:     General:        Right eye: No discharge.        Left eye: No discharge.  Cardiovascular:     Rate and Rhythm: Normal rate and regular rhythm.     Pulses:          Radial pulses are 2+ on the right side and 2+ on the left side.       Dorsalis pedis pulses are 2+ on the right side and 2+ on the left side.       Posterior tibial pulses are 2+ on the right side and 2+ on the left side.     Heart sounds: Normal heart sounds.  Pulmonary:     Effort: Pulmonary effort is normal. No respiratory distress.     Breath sounds: Normal breath sounds.  Abdominal:     General: Bowel sounds are normal. There is no distension.     Palpations: Abdomen is soft. There is no mass.     Tenderness: There is no abdominal tenderness. There is no guarding.     Comments: Abdomen soft, nondistended, nontender to palpation in all quadrants without guarding or peritoneal signs, no CVA tenderness bilaterally  Musculoskeletal:     Cervical back: Neck supple.     Comments: Pain and tenderness noted throughout the left leg, worse with range of motion.  No deformity, no focal joint swelling.  No erythema or warmth.  No appreciable swelling when compared to the right leg.  Pain worse with straight leg raise and patient does have some associated tenderness of the left low back.  Distal pulses 2+  Skin:    General: Skin is warm and dry.     Capillary Refill: Capillary refill takes less than 2 seconds.  Neurological:     Mental Status: She is alert and oriented to person, place, and time.     Comments: Alert, clear speech, following commands. Moving all extremities without difficulty. Bilateral lower extremities with 5/5 strength in proximal and distal muscle groups and with dorsi and plantar flexion. Sensation intact in  bilateral lower extremities. 2+ patellar DTRs bilaterally. Ambulatory with steady gait  Psychiatric:        Mood and Affect: Mood normal.        Behavior: Behavior normal.     ED Results / Procedures / Treatments  Labs (all labs ordered are listed, but only abnormal results are displayed) Labs Reviewed  CBC WITH DIFFERENTIAL/PLATELET - Abnormal; Notable for the following components:      Result Value   WBC 3.7 (*)    Neutro Abs 1.6 (*)    All other components within normal limits  BASIC METABOLIC PANEL    EKG None  Radiology VAS [place] LOWER EXTREMITY VENOUS (DVT) ([facility] and [facility] 7a-7p)  Result Date: 04/28/2020  Lower Venous DVT Study Patient Name:  LYLEE OLMEDO  Date of Exam:   04/28/2020 Medical Rec #: [id number]    Accession #:    [id number] Date of Birth: 1975/07/02    Patient Gender: F Patient Age:   23Y Exam Location:  [facility] Procedure:      VAS [place] LOWER EXTREMITY VENOUS (DVT) Referring Phys: [id number] Inella Kuwahara N Avyonna Wagoner --------------------------------------------------------------------------------  Indications: Swelling.  Risk Factors: None identified. Comparison Study: No prior studies. Performing Technologist: Oliver Hum RVT  Examination Guidelines: A complete evaluation includes B-mode imaging, spectral Doppler, color Doppler, and power Doppler as needed of all accessible portions of each vessel. Bilateral testing is considered an integral part of a complete examination. Limited examinations for reoccurring indications may be performed as noted. The reflux portion of the exam is performed with the patient in reverse Trendelenburg.  +-----+---------------+---------+-----------+----------+--------------+ RIGHTCompressibilityPhasicitySpontaneityPropertiesThrombus Aging +-----+---------------+---------+-----------+----------+--------------+ CFV  Full           Yes      Yes                                  +-----+---------------+---------+-----------+----------+--------------+   +---------+---------------+---------+-----------+----------+--------------+ LEFT     CompressibilityPhasicitySpontaneityPropertiesThrombus Aging +---------+---------------+---------+-----------+----------+--------------+ CFV      Full           Yes      Yes                                 +---------+---------------+---------+-----------+----------+--------------+ SFJ      Full                                                        +---------+---------------+---------+-----------+----------+--------------+ FV Prox  Full                                                        +---------+---------------+---------+-----------+----------+--------------+ FV Mid   Full                                                        +---------+---------------+---------+-----------+----------+--------------+ FV DistalFull                                                        +---------+---------------+---------+-----------+----------+--------------+ PFV  Full                                                        +---------+---------------+---------+-----------+----------+--------------+ POP      Full           Yes      Yes                                 +---------+---------------+---------+-----------+----------+--------------+ PTV      Full                                                        +---------+---------------+---------+-----------+----------+--------------+ PERO     Full                                                        +---------+---------------+---------+-----------+----------+--------------+     Summary: RIGHT: - No evidence of common femoral vein obstruction.  LEFT: - There is no evidence of deep vein thrombosis in the lower extremity.  - No cystic structure found in the popliteal fossa.  *See table(s) above for measurements and observations. Electronically signed  by Harold Barban MD on 04/28/2020 at 6:18:18 PM.    Final      Procedures Procedures   Medications Ordered in ED Medications  oxyCODONE-acetaminophen (PERCOCET/ROXICET) 5-325 MG per tablet 1 tablet (1 tablet Oral Given 04/28/20 1143)    ED Course  I have reviewed the triage vital signs and the nursing notes.  Pertinent labs & imaging results that were available during my care of the patient were reviewed by me and considered in my medical decision making (see chart for details).    MDM Rules/Calculators/A&P                         45 year old female presents to the emergency department for evaluation of left leg pain and swelling.  On exam there is no significant swelling noted, no erythema or warmth, does have tenderness throughout the back of the leg that is worse with range of motion.  Will get ultrasound to rule out DVT and check basic lab work, but also suspect this could be coming from her back, could have sciatica or radicular pain.  Will treat symptomatically.  I have independently ordered, reviewed and interpreted all labs and imaging: CBC: Mild leukopenia, normal hemoglobin and normal platelets BMP: No electrolyte derangements, normal renal function  Venous ultrasound: Negative for DVT  Discussed reassuring evaluation.  Discussed suspicion that pain may be coming more so from her back.  Will treat symptomatically for sciatica or radicular pain and have patient follow-up closely with her PCP.  Her blood pressure is noted to be elevated here today but she has not taken her medication.  Stressed the importance of taking medication routinely and will have her follow-up with her PCP for further blood pressure management.  She is not exhibiting signs or symptoms of hypertensive  urgency or emergency that require further emergent work-up or intervention.  Patient expresses understanding and agreement.  Discharged home in good condition.  Final Clinical Impression(s) / ED  Diagnoses Final diagnoses:  Left leg pain  Sciatica of left side    Rx / DC Orders ED Discharge Orders         Ordered    naproxen (NAPROSYN) 500 MG tablet  2 times daily        04/28/20 1423    methocarbamol (ROBAXIN) 500 MG tablet  2 times daily        04/28/20 1423    methylPREDNISolone (MEDROL DOSEPAK) 4 MG TBPK tablet  Status:  Discontinued        04/28/20 1423           Jacqlyn Larsen, PA-C 05/02/20 0112    Varney Biles, MD 05/02/20 1311

## 2020-04-30 ENCOUNTER — Other Ambulatory Visit: Payer: Self-pay

## 2020-04-30 ENCOUNTER — Ambulatory Visit (INDEPENDENT_AMBULATORY_CARE_PROVIDER_SITE_OTHER): Payer: Self-pay | Admitting: Nurse Practitioner

## 2020-04-30 ENCOUNTER — Encounter: Payer: Self-pay | Admitting: Nurse Practitioner

## 2020-04-30 VITALS — BP 133/99 | HR 62 | Temp 97.9°F | Ht 69.0 in | Wt 160.4 lb

## 2020-04-30 DIAGNOSIS — M5442 Lumbago with sciatica, left side: Secondary | ICD-10-CM

## 2020-04-30 DIAGNOSIS — I10 Essential (primary) hypertension: Secondary | ICD-10-CM

## 2020-04-30 DIAGNOSIS — M79605 Pain in left leg: Secondary | ICD-10-CM

## 2020-04-30 MED ORDER — TRIAMTERENE-HCTZ 75-50 MG PO TABS: 1.0000 | ORAL_TABLET | Freq: Every day | ORAL | 3 refills | Status: DC

## 2020-04-30 MED ORDER — CLONIDINE HCL 0.1 MG PO TABS: 0.1000 mg | ORAL_TABLET | Freq: Once | ORAL | Status: DC

## 2020-04-30 NOTE — Patient Instructions (Addendum)
Managing Your Hypertension Hypertension, also called high blood pressure, is when the force of the blood pressing against the walls of the arteries is too strong. Arteries are blood vessels that carry blood from your heart throughout your body. Hypertension forces the heart to work harder to pump blood and may cause the arteries to become narrow or stiff. Understanding blood pressure readings Your personal target blood pressure may vary depending on your medical conditions, your age, and other factors. A blood pressure reading includes a higher number over a lower number. Ideally, your blood pressure should be below 120/80. You should know that:  The first, or top, number is called the systolic pressure. It is a measure of the pressure in your arteries as your heart beats.  The second, or bottom number, is called the diastolic pressure. It is a measure of the pressure in your arteries as the heart relaxes. Blood pressure is classified into four stages. Based on your blood pressure reading, your health care provider may use the following stages to determine what type of treatment you need, if any. Systolic pressure and diastolic pressure are measured in a unit called mmHg. Normal  Systolic pressure: below 120.  Diastolic pressure: below 80. Elevated  Systolic pressure: 120-129.  Diastolic pressure: below 80. Hypertension stage 1  Systolic pressure: 130-139.  Diastolic pressure: 80-89. Hypertension stage 2  Systolic pressure: 140 or above.  Diastolic pressure: 90 or above. How can this condition affect me? Managing your hypertension is an important responsibility. Over time, hypertension can damage the arteries and decrease blood flow to important parts of the body, including the brain, heart, and kidneys. Having untreated or uncontrolled hypertension can lead to:  A heart attack.  A stroke.  A weakened blood vessel (aneurysm).  Heart failure.  Kidney damage.  Eye  damage.  Metabolic syndrome.  Memory and concentration problems.  Vascular dementia. What actions can I take to manage this condition? Hypertension can be managed by making lifestyle changes and possibly by taking medicines. Your health care provider will help you make a plan to bring your blood pressure within a normal range. Nutrition  Eat a diet that is high in fiber and potassium, and low in salt (sodium), added sugar, and fat. An example eating plan is called the Dietary Approaches to Stop Hypertension (DASH) diet. To eat this way: ? Eat plenty of fresh fruits and vegetables. Try to fill one-half of your plate at each meal with fruits and vegetables. ? Eat whole grains, such as whole-wheat pasta, brown rice, or whole-grain bread. Fill about one-fourth of your plate with whole grains. ? Eat low-fat dairy products. ? Avoid fatty cuts of meat, processed or cured meats, and poultry with skin. Fill about one-fourth of your plate with lean proteins such as fish, chicken without skin, beans, eggs, and tofu. ? Avoid pre-made and processed foods. These tend to be higher in sodium, added sugar, and fat.  Reduce your daily sodium intake. Most people with hypertension should eat less than 1,500 mg of sodium a day.   Lifestyle  Work with your health care provider to maintain a healthy body weight or to lose weight. Ask what an ideal weight is for you.  Get at least 30 minutes of exercise that causes your heart to beat faster (aerobic exercise) most days of the week. Activities may include walking, swimming, or biking.  Include exercise to strengthen your muscles (resistance exercise), such as weight lifting, as part of your weekly exercise routine. Try   to do these types of exercises for 30 minutes at least 3 days a week.  Do not use any products that contain nicotine or tobacco, such as cigarettes, e-cigarettes, and chewing tobacco. If you need help quitting, ask your health care  provider.  Control any long-term (chronic) conditions you have, such as high cholesterol or diabetes.  Identify your sources of stress and find ways to manage stress. This may include meditation, deep breathing, or making time for fun activities.   Alcohol use  Do not drink alcohol if: ? Your health care provider tells you not to drink. ? You are pregnant, may be pregnant, or are planning to become pregnant.  If you drink alcohol: ? Limit how much you use to:  0-1 drink a day for women.  0-2 drinks a day for men. ? Be aware of how much alcohol is in your drink. In the [place] one drink equals one 12 oz bottle of beer (355 mL), one 5 oz glass of wine (148 mL), or one 1 oz glass of hard liquor (44 mL). Medicines Your health care provider may prescribe medicine if lifestyle changes are not enough to get your blood pressure under control and if:  Your systolic blood pressure is 130 or higher.  Your diastolic blood pressure is 80 or higher. Take medicines only as told by your health care provider. Follow the directions carefully. Blood pressure medicines must be taken as told by your health care provider. The medicine does not work as well when you skip doses. Skipping doses also puts you at risk for problems. Monitoring Before you monitor your blood pressure:  Do not smoke, drink caffeinated beverages, or exercise within 30 minutes before taking a measurement.  Use the bathroom and empty your bladder (urinate).  Sit quietly for at least 5 minutes before taking measurements. Monitor your blood pressure at home as told by your health care provider. To do this:  Sit with your back straight and supported.  Place your feet flat on the floor. Do not cross your legs.  Support your arm on a flat surface, such as a table. Make sure your upper arm is at heart level.  Each time you measure, take two or three readings one minute apart and record the results. You may also need to have your  blood pressure checked regularly by your health care provider.   General information  Talk with your health care provider about your diet, exercise habits, and other lifestyle factors that may be contributing to hypertension.  Review all the medicines you take with your health care provider because there may be side effects or interactions.  Keep all visits as told by your health care provider. Your health care provider can help you create and adjust your plan for managing your high blood pressure. Where to find more information  National Heart, Lung, and Blood Institute: www.nhlbi.nih.gov  American Heart Association: www.heart.org Contact a health care provider if:  You think you are having a reaction to medicines you have taken.  You have repeated (recurrent) headaches.  You feel dizzy.  You have swelling in your ankles.  You have trouble with your vision. Get help right away if:  You develop a severe headache or confusion.  You have unusual weakness or numbness, or you feel faint.  You have severe pain in your chest or abdomen.  You vomit repeatedly.  You have trouble breathing. These symptoms may represent a serious problem that is an emergency. Do not wait   to see if the symptoms will go away. Get medical help right away. Call your local emergency services (911 in the [place] Do not drive yourself to the hospital. Summary  Hypertension is when the force of blood pumping through your arteries is too strong. If this condition is not controlled, it may put you at risk for serious complications.  Your personal target blood pressure may vary depending on your medical conditions, your age, and other factors. For most people, a normal blood pressure is less than 120/80.  Hypertension is managed by lifestyle changes, medicines, or both.  Lifestyle changes to help manage hypertension include losing weight, eating a healthy, low-sodium diet, exercising more, stopping smoking, and  limiting alcohol. This information is not intended to replace advice given to you by your health care provider. Make sure you discuss any questions you have with your health care provider. Document Revised: 01/24/2019 Document Reviewed: 11/19/2018 Elsevier Patient Education  2021 [place] or St[facility] Ask your health care provider which exercises are safe for you. Do exercises exactly as told by your health care provider and adjust them as directed. It is normal to feel mild stretching, pulling, tightness, or discomfort as you do these exercises. Stop right away if you feel sudden pain or your pain gets worse. Do not begin these exercises until told by your health care provider. Stretching and range-of-motion exercises These exercises warm up your muscles and joints and improve the movement and flexibility of your back. These exercises also help to relieve pain, numbness, and tingling. Lumbar rotation 1. Lie on your back on a firm surface and bend your knees. 2. Straighten your arms out to your sides so each arm forms a 90-degree angle (right angle) with a side of your body. 3. Slowly move (rotate) both of your knees to one side of your body until you feel a stretch in your lower back (lumbar). Try not to let your shoulders lift off the floor. 4. Hold this position for __________ seconds. 5. Tense your abdominal muscles and slowly move your knees back to the starting position. 6. Repeat this exercise on the other side of your body. Repeat __________ times. Complete this exercise __________ times a day.   Single knee to chest 1. Lie on your back on a firm surface with both legs straight. 2. Bend one of your knees. Use your hands to move your knee up toward your chest until you feel a gentle stretch in your lower back and buttock. ? Hold your leg in this position by holding on to the front of your knee. ? Keep your other leg as straight as possible. 3. Hold this  position for __________ seconds. 4. Slowly return to the starting position. 5. Repeat with your other leg. Repeat __________ times. Complete this exercise __________ times a day.   Prone extension on elbows 1. Lie on your abdomen on a firm surface (prone position). 2. Prop yourself up on your elbows. 3. Use your arms to help lift your chest up until you feel a gentle stretch in your abdomen and your lower back. ? This will place some of your body weight on your elbows. If this is uncomfortable, try stacking pillows under your chest. ? Your hips should stay down, against the surface that you are lying on. Keep your hip and back muscles relaxed. 4. Hold this position for __________ seconds. 5. Slowly relax your upper body and return to the starting position. Repeat __________  times. Complete this exercise __________ times a day.   Strengthening exercises These exercises build strength and endurance in your back. Endurance is the ability to use your muscles for a long time, even after they get tired. Pelvic tilt This exercise strengthens the muscles that lie deep in the abdomen. 1. Lie on your back on a firm surface. Bend your knees and keep your feet flat on the floor. 2. Tense your abdominal muscles. Tip your pelvis up toward the ceiling and flatten your lower back into the floor. ? To help with this exercise, you may place a small towel under your lower back and try to push your back into the towel. 3. Hold this position for __________ seconds. 4. Let your muscles relax completely before you repeat this exercise. Repeat __________ times. Complete this exercise __________ times a day. Alternating arm and leg raises 1. Get on your hands and knees on a firm surface. If you are on a hard floor, you may want to use padding, such as an exercise mat, to cushion your knees. 2. Line up your arms and legs. Your hands should be directly below your shoulders, and your knees should be directly below your  hips. 3. Lift your left leg behind you. At the same time, raise your right arm and straighten it in front of you. ? Do not lift your leg higher than your hip. ? Do not lift your arm higher than your shoulder. ? Keep your abdominal and back muscles tight. ? Keep your hips facing the ground. ? Do not arch your back. ? Keep your balance carefully, and do not hold your breath. 4. Hold this position for __________ seconds. 5. Slowly return to the starting position. 6. Repeat with your right leg and your left arm. Repeat __________ times. Complete this exercise __________ times a day.   Abdominal set with straight leg raise 1. Lie on your back on a firm surface. 2. Bend one of your knees and keep your other leg straight. 3. Tense your abdominal muscles and lift your straight leg up, 4-6 inches (10-15 cm) off the ground. 4. Keep your abdominal muscles tight and hold this position for __________ seconds. ? Do not hold your breath. ? Do not arch your back. Keep it flat against the ground. 5. Keep your abdominal muscles tense as you slowly lower your leg back to the starting position. 6. Repeat with your other leg. Repeat __________ times. Complete this exercise __________ times a day.   Single leg lower with bent knees 1. Lie on your back on a firm surface. 2. Tense your abdominal muscles and lift your feet off the floor, one foot at a time, so your knees and hips are bent in 90-degree angles (right angles). ? Your knees should be over your hips and your lower legs should be parallel to the floor. 3. Keeping your abdominal muscles tense and your knee bent, slowly lower one of your legs so your toe touches the ground. 4. Lift your leg back up to return to the starting position. ? Do not hold your breath. ? Do not let your back arch. Keep your back flat against the ground. 5. Repeat with your other leg. Repeat __________ times. Complete this exercise __________ times a day. Posture and body  mechanics Good posture and healthy body mechanics can help to relieve stress in your body's tissues and joints. Body mechanics refers to the movements and positions of your body while you do your daily activities. Posture  is part of body mechanics. Good posture means:  Your spine is in its natural S-curve position (neutral).  Your shoulders are pulled back slightly.  Your head is not tipped forward. Follow these guidelines to improve your posture and body mechanics in your everyday activities. Standing  When standing, keep your spine neutral and your feet about hip width apart. Keep a slight bend in your knees. Your ears, shoulders, and hips should line up.  When you do a task in which you stand in one place for a long time, place one foot up on a stable object that is 2-4 inches (5-10 cm) high, such as a footstool. This helps keep your spine neutral.   Sitting  When sitting, keep your spine neutral and keep your feet flat on the floor. Use a footrest, if necessary, and keep your thighs parallel to the floor. Avoid rounding your shoulders, and avoid tilting your head forward.  When working at a desk or a computer, keep your desk at a height where your hands are slightly lower than your elbows. Slide your chair under your desk so you are close enough to maintain good posture.  When working at a computer, place your monitor at a height where you are looking straight ahead and you do not have to tilt your head forward or downward to look at the screen.   Resting  When lying down and resting, avoid positions that are most painful for you.  If you have pain with activities such as sitting, bending, stooping, or squatting, lie in a position in which your body does not bend very much. For example, avoid curling up on your side with your arms and knees near your chest (fetal position).  If you have pain with activities such as standing for a long time or reaching with your arms, lie with your  spine in a neutral position and bend your knees slightly. Try the following positions: ? Lying on your side with a pillow between your knees. ? Lying on your back with a pillow under your knees. Lifting  When lifting objects, keep your feet at least shoulder width apart and tighten your abdominal muscles.  Bend your knees and hips and keep your spine neutral. It is important to lift using the strength of your legs, not your back. Do not lock your knees straight out.  Always ask for help to lift heavy or awkward objects.   This information is not intended to replace advice given to you by your health care provider. Make sure you discuss any questions you have with your health care provider. Document Revised: 04/12/2018 Document Reviewed: 01/10/2018 Elsevier Patient Education  [place].

## 2020-04-30 NOTE — Progress Notes (Signed)
[place], [place]  [address] Phone:  [phone]   Fax:  [phone]   Established Patient Office Visit  Subjective:  Patient ID: Krista [name], female    DOB: Jun 03, 1975  Age: 45 y.o. MRN: [id number]  CC:  Chief Complaint  Patient presents with  . Follow-up    Follow up for b/p , when to hospital for blood pressure when sent ob/gyn  , she is taking medication  daily     HPI Krista [name] presents for follow up . She  has a past medical history of Hypertension and Migraine.     Hypertension Patient is here for follow-up of elevated blood pressure.She has been compliant with her medication.  She is currently taking Norvasc 10 mg daily along with Dyazide 37.5/25 mg daily.  She reports that she was following up with gynecology for procedure and was sent to the emergency room due to elevated blood pressure.  Blood pressure 162/120; ED blood pressure 166/127.  Patient was treated for leg pain.  She did not receive any change in her treatment.  She was also evaluated for blood clot in her left leg which was negative.  She continues to have leg pain.  She describes it as a pulling sensation in the thigh that goes up into the lower back.  Krista [name] reports being asymptomatic for cardiovascular symptoms. Denies headache, dizziness, visual changes, shortness of breath, dyspnea on exertion, chest pain, nausea, vomiting or any edema.  She denies family history of malignant hypertension.  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  Past Medical History:  Diagnosis Date  . Hypertension   . Migraine     History reviewed. No pertinent surgical history.  Family History  Problem Relation Age of Onset  . Cancer Mother   . Diabetes Mother   . Hypertension Mother   . Hypertension Father     Social History   Socioeconomic History  . Marital status: Single    Spouse name: Not on file  . Number of children: Not on file  . Years of  education: Not on file  . Highest education level: Not on file  Occupational History  . Not on file  Tobacco Use  . Smoking status: Former Smoker    Types: Cigarettes  . Smokeless tobacco: Never Used  Vaping Use  . Vaping Use: Never used  Substance and Sexual Activity  . Alcohol use: No  . Drug use: No  . Sexual activity: Not Currently    Partners: Male    Birth control/protection: None  Other Topics Concern  . Not on file  Social History Narrative  . Not on file   Social Determinants of Health   Financial Resource Strain: Not on file  Food Insecurity: Not on file  Transportation Needs: Not on file  Physical Activity: Not on file  Stress: Not on file  Social Connections: Not on file  Intimate Partner Violence: Not on file    Outpatient Medications Prior to Visit  Medication Sig Dispense Refill  . amLODipine (NORVASC) 10 MG tablet Take 1 tablet (10 mg total) by mouth daily. 90 tablet 3  . ferrous sulfate 325 (65 FE) MG tablet Take 1 tablet (325 mg total) by mouth every other day. 60 tablet 5  . megestrol (MEGACE) 40 MG tablet Take 1 tablet (40 mg total) by mouth 2 (two) times daily. 60 tablet 1  . methocarbamol (ROBAXIN) 500 MG tablet Take  1 tablet (500 mg total) by mouth 2 (two) times daily. 20 tablet 0  . naproxen (NAPROSYN) 500 MG tablet Take 1 tablet (500 mg total) by mouth 2 (two) times daily. 30 tablet 0  . Prenat-Fe Poly-Methfol-FA-DHA (VITAFOL ULTRA) 29-0.6-0.4-200 MG CAPS Take 1 capsule by mouth daily before breakfast. 90 capsule 4  . triamterene-hydrochlorothiazide (DYAZIDE) 37.5-25 MG capsule Take 1 each (1 capsule total) by mouth daily. 90 capsule 3  . methylPREDNISolone (MEDROL DOSEPAK) 4 MG TBPK tablet Take as directed 21 tablet 0  . metroNIDAZOLE (FLAGYL) 500 MG tablet Take 1 tablet (500 mg total) by mouth 2 (two) times daily. (Patient not taking: No sig reported) 14 tablet 2  . naproxen sodium (ALEVE) 220 MG tablet Take 220 mg by mouth 2 (two) times daily as  needed (pain/headache).     Facility-Administered Medications Prior to Visit  Medication Dose Route Frequency Provider Last Rate Last Admin  . cloNIDine (CATAPRES) tablet 0.2 mg  0.2 mg Oral Once Vevelyn Francois, NP        No Known Allergies  ROS Review of Systems    Objective:    Physical Exam Constitutional:      General: She is not in acute distress.    Appearance: She is not ill-appearing, toxic-appearing or diaphoretic.  HENT:     Head: Normocephalic and atraumatic.     Nose: Nose normal.     Mouth/Throat:     Mouth: Mucous membranes are moist.  Cardiovascular:     Rate and Rhythm: Normal rate and regular rhythm.     Pulses: Normal pulses.     Heart sounds: Normal heart sounds.  Pulmonary:     Effort: Pulmonary effort is normal.     Breath sounds: Normal breath sounds.  Musculoskeletal:     Cervical back: Normal range of motion.     Lumbar back: No swelling, edema or lacerations. Normal range of motion. Positive left straight leg raise test.     Right hip: Normal.     Left hip: No tenderness. Decreased range of motion.     Right lower leg: No edema.     Left lower leg: No edema.  Skin:    General: Skin is warm and dry.     Capillary Refill: Capillary refill takes less than 2 seconds.  Neurological:     General: No focal deficit present.     Mental Status: She is alert and oriented to person, place, and time.  Psychiatric:        Mood and Affect: Mood normal.        Behavior: Behavior normal.        Thought Content: Thought content normal.        Judgment: Judgment normal.     BP (!) 133/99   Pulse 62   Temp 97.9 F (36.6 C) (Temporal)   Ht 5\' 9"  (1.753 m)   Wt 160 lb 6.4 oz (72.8 kg)   SpO2 99%   BMI 23.69 kg/m  Wt Readings from Last 3 Encounters:  04/30/20 160 lb 6.4 oz (72.8 kg)  04/28/20 156 lb (70.8 kg)  04/01/20 157 lb (71.2 kg)     Health Maintenance Due  Topic Date Due  . COVID-19 Vaccine (1) Never done  . MAMMOGRAM  Never done     There are no preventive care reminders to display for this patient.  No results found for: TSH Lab Results  Component Value Date   WBC 3.7 (L) 04/28/2020  HGB 12.3 04/28/2020   HCT 38.9 04/28/2020   MCV 88.2 04/28/2020   PLT 311 04/28/2020   Lab Results  Component Value Date   NA 140 04/28/2020   K 3.8 04/28/2020   CO2 22 04/28/2020   GLUCOSE 84 04/28/2020   BUN 11 04/28/2020   CREATININE 0.91 04/28/2020   BILITOT 0.3 09/28/2016   ALKPHOS 49 09/28/2016   AST 16 09/28/2016   ALT 10 (L) 09/28/2016   PROT 7.4 09/28/2016   ALBUMIN 4.0 09/28/2016   CALCIUM 9.4 04/28/2020   ANIONGAP 8 04/28/2020   Lab Results  Component Value Date   CHOL 150 04/01/2020   Lab Results  Component Value Date   HDL 39 (L) 04/01/2020   Lab Results  Component Value Date   LDLCALC 102 (H) 04/01/2020   Lab Results  Component Value Date   TRIG 41 04/01/2020   Lab Results  Component Value Date   CHOLHDL 3.8 04/01/2020   No results found for: HGBA1C    Assessment & Plan:   Problem List Items Addressed This Visit   None   Visit Diagnoses    HTN (hypertension), benign    -  Primary Worsening increased Dyazide to Maxide 75/50 mg/day.  Recommend renal ultrasound to assess the arteries in the knee will refer to cardiology for further evaluation Encouraged on going compliance with current medication regimen Encouraged home monitoring and recording BP <130/80 Eating a heart-healthy diet with less salt Encouraged regular physical activity   Relevant Medications   cloNIDine (CATAPRES) tablet 0.1 mg   triamterene-hydrochlorothiazide (MAXZIDE) 75-50 MG tablet   Other Relevant Orders   US Renal Artery Stenosis   Ambulatory referral to Cardiology   POCT urinalysis dipstick   Left leg pain  Persistent we will evaluate back to see if this is the cause      Relevant Orders   DG Lumbar Spine Complete   Acute left-sided low back pain with left-sided sciatica       Relevant Orders   DG  Lumbar Spine Complete      Meds ordered this encounter  Medications  . cloNIDine (CATAPRES) tablet 0.1 mg  . triamterene-hydrochlorothiazide (MAXZIDE) 75-50 MG tablet    Sig: Take 1 tablet by mouth daily.    Dispense:  90 tablet    Refill:  3    Order Specific Question:   Supervising Provider    Answer:   Tresa Garter [id number]    Follow-up: Return in about 4 weeks (around 05/28/2020) for Follow up HTN 57322.    Vevelyn Francois, NP

## 2020-05-10 ENCOUNTER — Other Ambulatory Visit: Payer: Medicaid Other | Admitting: Obstetrics

## 2020-05-20 ENCOUNTER — Telehealth: Payer: Self-pay

## 2020-05-20 NOTE — Telephone Encounter (Signed)
Returned call and pt stated that she has been on megace and still bleeding since Oct 2021. Pt would like to discuss hysterectomy, Dr Jodi Mourning advised pt be scheduled with another provider for consult. Informed scheduler to contact pt

## 2020-05-26 ENCOUNTER — Other Ambulatory Visit: Payer: Self-pay

## 2020-05-26 ENCOUNTER — Ambulatory Visit (INDEPENDENT_AMBULATORY_CARE_PROVIDER_SITE_OTHER): Payer: Self-pay | Admitting: Obstetrics and Gynecology

## 2020-05-26 ENCOUNTER — Encounter: Payer: Self-pay | Admitting: Obstetrics and Gynecology

## 2020-05-26 DIAGNOSIS — N920 Excessive and frequent menstruation with regular cycle: Secondary | ICD-10-CM | POA: Insufficient documentation

## 2020-05-26 DIAGNOSIS — D259 Leiomyoma of uterus, unspecified: Secondary | ICD-10-CM

## 2020-05-26 DIAGNOSIS — N921 Excessive and frequent menstruation with irregular cycle: Secondary | ICD-10-CM

## 2020-05-26 MED ORDER — NORETHINDRONE ACETATE 5 MG PO TABS: 5.0000 mg | ORAL_TABLET | Freq: Every day | ORAL | 2 refills | Status: DC

## 2020-05-26 NOTE — Patient Instructions (Signed)
Vaginal Hysterectomy, Care After The following information offers guidance on how to care for yourself after your procedure. Your health care provider may also give you more specific instructions. If you have problems or questions, contact your health care provider. What can I expect after the procedure? After the procedure, it is common to have:  Pain in the lower abdomen and vagina.  Vaginal bleeding and discharge for up to 1 week. You will need to use a sanitary pad after this procedure.  Difficulty having a bowel movement (constipation).  Temporary problems emptying the bladder.  Tiredness (fatigue).  Poor appetite.  Less interest in sex.  Feelings of sadness or other emotions. If your ovaries were also removed, it is also common to have symptoms of menopause, such as hot flashes, night sweats, and lack of sleep (insomnia). Follow these instructions at home: Medicines  Take over-the-counter and prescription medicines only as told by your health care provider.  Do not take aspirin or NSAIDs, such as ibuprofen. These medicines can cause bleeding.  Ask your health care provider if the medicine prescribed to you: ? Requires you to avoid driving or using machinery. ? Can cause constipation. You may need to take these actions to prevent or treat constipation:  Drink enough fluid to keep your urine pale yellow.  Take over-the-counter or prescription medicines.  Eat foods that are high in fiber, such as beans, whole grains, and fresh fruits and vegetables.  Limit foods that are high in fat and processed sugars, such as fried or sweet foods.   Activity  Rest as told by your health care provider.  Return to your normal activities as told by your health care provider. Ask your health care provider what activities are safe for you  Avoid sitting for a long time without moving. Get up to take short walks every 1-2 hours. This is important to improve blood flow and breathing. Ask  for help if you feel weak or unsteady.  Try to have someone home with you for 1-2 weeks to help you with everyday chores.  Do not lift anything that is heavier than 10 lb (4.5 kg), or the limit that you are told, until your health care provider says that it is safe.  If you were given a sedative during the procedure, it can affect you for several hours. Do not drive or operate machinery until your health care provider says that it is safe.   Lifestyle  Do not use any products that contain nicotine or tobacco. These products include cigarettes, chewing tobacco, and vaping devices, such as e-cigarettes. These can delay healing after surgery. If you need help quitting, ask your health care provider.  Do not drink alcohol until your health care provider approves. General instructions  Do not douche, use tampons, or have sex for at least 6 weeks, or as told by your health care provider.  If you struggle with physical or emotional changes after your procedure, speak with your health care provider or a therapist.  The stitches inside your vagina will dissolve over time and do not need to be taken out.  Do not take baths, swim, or use a hot tub until your health care provider approves. You may only be allowed to take showers for 2-3 weeks  Wear compression stockings as told by your health care provider. These stockings help to prevent blood clots and reduce swelling in your legs.  Keep all follow-up visits. This is important. Contact a health care provider if:  Your pain medicine is not helping.  You have a fever.  You have nausea or vomiting that does not go away.  You feel dizzy.  You have blood, pus, or a bad-smelling discharge from your vagina more than 1 week after the procedure.  You continue to have trouble urinating 3-5 days after the procedure. Get help right away if:  You have severe pain in your abdomen or back.  You faint.  You have heavy vaginal bleeding and blood  clots, soaking through a sanitary pad in less than 1 hour.  You have chest pain or shortness of breath.  You have pain, swelling, or redness in your leg. These symptoms may represent a serious problem that is an emergency. Do not wait to see if the symptoms will go away. Get medical help right away. Call your local emergency services (911 in the [place] Do not drive yourself to the hospital. Summary  After the procedure, it is common to have pain, vaginal bleeding, constipation, temporary problems emptying your bladder, and feelings of sadness or other emotions.  Take over-the-counter and prescription medicines only as told by your health care provider.  Rest as told by your health care provider. Return to your normal activities as told by your health care provider.  Contact a health care provider if your pain medicine is not helping, or you have a fever, dizziness, or trouble urinating several days after the procedure.  Get help right away if you have severe pain in your abdomen or back, or if you faint, have heavy bleeding, or have chest pain or shortness of breath. This information is not intended to replace advice given to you by your health care provider. Make sure you discuss any questions you have with your health care provider. Document Revised: 08/22/2019 Document Reviewed: 08/22/2019 Elsevier Patient Education  2021 [employer]. Vaginal Hysterectomy  A vaginal hysterectomy is a procedure to remove all or part of the uterus through a small incision in the vagina. In this procedure, your health care provider may remove your entire uterus, including the cervix. The cervix is the opening and bottom part of the uterus and is located between the vagina and the uterus. Sometimes, the ovaries and fallopian tubes are also removed. This surgery may be done to treat problems such as:  Noncancerous growths in the uterus (uterine fibroids) that cause symptoms.  A condition that causes the  lining of the uterus to grow in other areas (endometriosis).  Problems with pelvic support.  Cancer of the cervix, ovaries, uterus, or tissue that lines the uterus (endometrium).  Excessive bleeding in the uterus. When removing your uterus, your health care provider may also remove the ovaries and the fallopian tubes. After this procedure, you will no longer be able to have a baby, and you will no longer have a menstrual period. Tell a health care provider about:  Any allergies you have.  All medicines you are taking, including vitamins, herbs, eye drops, creams, and over-the-counter medicines.  Any problems you or family members have had with anesthetic medicines.  Any blood disorders you have.  Any surgeries you have had.  Any medical conditions you have.  Whether you are pregnant or may be pregnant. What are the risks? Generally, this is a safe procedure. However, problems may occur, including:  Bleeding.  Infection.  Blood clots in the legs or lungs.  Damage to nearby structures or organs.  Pain during sex.  Allergic reactions to medicines. What happens before the procedure?  Staying hydrated Follow instructions from your health care provider about hydration, which may include:  Up to 2 hours before the procedure - you may continue to drink clear liquids, such as water, clear fruit juice, black coffee, and plain tea.   Eating and drinking restrictions Follow instructions from your health care provider about eating and drinking, which may include:  8 hours before the procedure - stop eating heavy meals or foods, such as meat, fried foods, or fatty foods.  6 hours before the procedure - stop eating light meals or foods, such as toast or cereal.  6 hours before the procedure - stop drinking milk or drinks that contain milk.  2 hours before the procedure - stop drinking clear liquids. Medicines  Ask your health care provider about: ? Changing or stopping your  regular medicines. This is especially important if you are taking diabetes medicines or blood thinners. ? Taking medicines such as aspirin and ibuprofen. These medicines can thin your blood. Do not take these medicines unless your health care provider tells you to take them. ? Taking over-the-counter medicines, vitamins, herbs, and supplements.  You may be asked to take a medicine to empty your colon (bowel preparation). General instructions  If you were asked to do a bowel preparation before the procedure, follow instructions from your health care provider.  This procedure can affect the way you feel about yourself. Talk with your health care provider about the physical and emotional changes hysterectomy may cause.  Do not use any products that contain nicotine or tobacco for at least 4 weeks before the procedure. These products include cigarettes, e-cigarettes, and chewing tobacco. If you need help quitting, ask your health care provider.  Plan to have a responsible adult take you home from the hospital or clinic.  Plan to have a responsible adult care for you for the time you are told after you leave the hospital or clinic. This is important. Surgery safety Ask your health care provider:  How your surgery site will be marked.  What steps will be taken to help prevent infection. These may include: ? Removing hair at the surgery site. ? Washing skin with a germ-killing soap. ? Receiving antibiotic medicine. What happens during the procedure?  An IV will be inserted into one of your veins.  You will be given one or more of the following: ? A medicine to help you relax (sedative). ? A medicine to numb the area (local anesthetic). ? A medicine to make you fall asleep (general anesthetic). ? A medicine that is injected into your spine to numb the area below and slightly above the injection site (spinal anesthetic). ? A medicine that is injected into an area of your body to numb  everything below the injection site (regional anesthetic).  Your surgeon will make an incision in your vagina.  Your surgeon will locate and remove all or part of your uterus. Part or all of the uterus will be removed through the vagina.  Your ovaries and fallopian tubes may be removed at the same time.  The incision in your vagina will be closed with stitches (sutures) that dissolve over time. The procedure may vary among health care providers and hospitals. What happens after the procedure?  Your blood pressure, heart rate, breathing rate, and blood oxygen level will be monitored until you leave the hospital or clinic.  You will be encouraged to walk as soon as possible. You will also use a device or do breathing exercises to  keep your lungs clear.  You may have to wear compression stockings. These stockings help to prevent blood clots and reduce swelling in your legs.  You will be given pain medicine as needed.  You will need to wear a sanitary pad for vaginal discharge or bleeding. Summary  A vaginal hysterectomy is a procedure to remove all or part of the uterus through the vagina.  You may need a vaginal hysterectomy to treat a variety of abnormalities of the uterus.  Plan to have a responsible adult take you home from the hospital or clinic.  Plan to have a responsible adult care for you for the time you are told after you leave the hospital or clinic. This is important. This information is not intended to replace advice given to you by your health care provider. Make sure you discuss any questions you have with your health care provider. Document Revised: 08/22/2019 Document Reviewed: 08/22/2019 Elsevier Patient Education  [place].

## 2020-05-26 NOTE — Progress Notes (Signed)
Took last dose of Megace yesterday. Also out of Robaxin.States has been bleeding since Oct 17, 2019. Using 4 overnight pads per day. Reports large clots.

## 2020-05-26 NOTE — Progress Notes (Signed)
Patient ID: Krista [name], female   DOB: May 31, 1975, 45 y.o.   MRN: [id number] Ms Krista [name] presents in referral from Dr Jodi Mourning for heavy bleeding. Pt has had heavy bleeding since Oct of last year. She has tried medication to little affect. GYN U/S normal except for uterine fibroids, vol 228.  H/O HTN, managed by PCP  TSVD x 1 ( 7 # )  SAB x 1  PE AF BP 152/103 Lungs clear Heart RRR Abd soft + BS GU Nl EGBUS, uterus small, mobile, slightly tender, no masses  A/P Menorrhagia with irregular cycles        Uterine fibroids  Pt desires definite therapy. TVH with possible salpingectomy reviewed with pt. R/B/Post op care discussed. Hysterectomy papers signed. Continue to see PCP for BP control. Will schedule. Will stop Megace and try Aygestin.  F/U with post op appt.

## 2020-06-01 ENCOUNTER — Telehealth: Payer: Self-pay | Admitting: *Deleted

## 2020-06-01 ENCOUNTER — Encounter: Payer: Self-pay | Admitting: *Deleted

## 2020-06-01 NOTE — Telephone Encounter (Signed)
Call to patient. Advised surgery scheduled for Tuesday, July 13, 2020 at 9::30 am, arrive 7:30 am. [place] will receive letter in the mail and call from Pre-op nurse.   Encounter closed.

## 2020-06-02 ENCOUNTER — Ambulatory Visit: Payer: Medicaid Other | Admitting: Nurse Practitioner

## 2020-06-15 ENCOUNTER — Encounter (HOSPITAL_COMMUNITY): Payer: Self-pay | Admitting: *Deleted

## 2020-06-15 ENCOUNTER — Emergency Department (HOSPITAL_COMMUNITY)
Admission: EM | Admit: 2020-06-15 | Discharge: 2020-06-15 | Disposition: A | Discharge status: Home / Self Care | Payer: No Typology Code available for payment source | Attending: Emergency Medicine | Admitting: Emergency Medicine

## 2020-06-15 ENCOUNTER — Other Ambulatory Visit: Payer: Self-pay

## 2020-06-15 DIAGNOSIS — Z79899 Other long term (current) drug therapy: Secondary | ICD-10-CM | POA: Insufficient documentation

## 2020-06-15 DIAGNOSIS — Z87891 Personal history of nicotine dependence: Secondary | ICD-10-CM | POA: Insufficient documentation

## 2020-06-15 DIAGNOSIS — I10 Essential (primary) hypertension: Secondary | ICD-10-CM | POA: Diagnosis not present

## 2020-06-15 DIAGNOSIS — M5432 Sciatica, left side: Secondary | ICD-10-CM | POA: Insufficient documentation

## 2020-06-15 DIAGNOSIS — M79605 Pain in left leg: Secondary | ICD-10-CM | POA: Diagnosis present

## 2020-06-15 MED ORDER — NAPROXEN 500 MG PO TABS: 500.0000 mg | ORAL_TABLET | Freq: Two times a day (BID) | ORAL | 0 refills | Status: DC

## 2020-06-15 MED ORDER — METHOCARBAMOL 500 MG PO TABS: 500.0000 mg | ORAL_TABLET | Freq: Two times a day (BID) | ORAL | 0 refills | Status: DC

## 2020-06-15 MED ORDER — PREDNISONE 20 MG PO TABS: ORAL_TABLET | ORAL | 0 refills | Status: DC

## 2020-06-15 NOTE — Discharge Instructions (Addendum)
Your symptom is suggestive of sciatica, please follow instruction below.  Reach out to your orthopedist to see if you can get an MRI at an earlier date if possible

## 2020-06-15 NOTE — ED Triage Notes (Addendum)
Pt reports left leg swelling and pain x 1 month. Pain radiates from buttocks down left leg. She went to spine specialist, prescribed gabapentin last week which she reports is not helping.

## 2020-06-15 NOTE — ED Provider Notes (Signed)
[facility] DEPT Provider Note   CSN: [id number] Arrival date & time: 06/15/20  8127     History Chief Complaint  Patient presents with   Leg Swelling   Leg Pain    Krista [name] is a 45 y.o. female.  The history is provided by the patient and medical records. No language interpreter was used.  Leg Pain Associated symptoms: no fever    45 year old female second history of hypertension, migraine, presenting with recurrent leg pain.  Patient states for more than a month she has had recurrent pain radiating from her lower back down to her left leg.  Pain is sharp shooting burning sensation radiates down towards her foot worsening when she picks up her leg and sometimes with movement.  She has difficulty sleeping at night sometimes when she lays on the affected side.  She denies any fever chills no bowel bladder incontinence or saddle anesthesia no rash no trauma.  She has been seen for her symptoms last month in the ER.  She had a DVT study that was negative.  She was prescribed medication as well as referral to orthopedist.  She did follow-up with orthopedic and does have an MRI scheduled in 2 weeks.  She is here due to progressive worsening pain.  She denies any history of active cancer or IV drug use.  Past Medical History:  Diagnosis Date   Hypertension    Migraine     Patient Active Problem List   Diagnosis Date Noted   Menorrhagia 05/26/2020   Uterine fibroid 05/26/2020    History reviewed. No pertinent surgical history.   OB History     Gravida  2   Para  1   Term  1   Preterm      AB      Living  1      SAB      IAB      Ectopic      Multiple      Live Births  1           Family History  Problem Relation Age of Onset   Cancer Mother    Diabetes Mother    Hypertension Mother    Hypertension Father     Social History   Tobacco Use   Smoking status: Former    Pack years: 0.00    Types: Cigarettes    Smokeless tobacco: Never  Vaping Use   Vaping Use: Never used  Substance Use Topics   Alcohol use: No   Drug use: No    Home Medications Prior to Admission medications   Medication Sig Start Date End Date Taking? Authorizing Provider  amLODipine (NORVASC) 10 MG tablet Take 1 tablet (10 mg total) by mouth daily. 04/01/20 04/01/21  Vevelyn Francois, NP  ferrous sulfate 325 (65 FE) MG tablet Take 1 tablet (325 mg total) by mouth every other day. 02/24/20   Shelly Bombard, MD  methocarbamol (ROBAXIN) 500 MG tablet Take 1 tablet (500 mg total) by mouth 2 (two) times daily. 04/28/20   Jacqlyn Larsen, PA-C  naproxen (NAPROSYN) 500 MG tablet Take 1 tablet (500 mg total) by mouth 2 (two) times daily. 04/28/20   Jacqlyn Larsen, PA-C  norethindrone (AYGESTIN) 5 MG tablet Take 1 tablet (5 mg total) by mouth daily. 05/26/20   Chancy Milroy, MD  Prenat-Fe Poly-Methfol-FA-DHA (VITAFOL ULTRA) 29-0.6-0.4-200 MG CAPS Take 1 capsule by mouth daily before breakfast. 02/24/20   Jodi Mourning,  Clenton Pare, MD  triamterene-hydrochlorothiazide (MAXZIDE) 75-50 MG tablet Take 1 tablet by mouth daily. 04/30/20   Vevelyn Francois, NP    Allergies    Patient has no known allergies.  Review of Systems   Review of Systems  Constitutional:  Negative for fever.  Musculoskeletal:  Positive for arthralgias.  Skin:  Negative for wound.  Neurological:  Negative for numbness.   Physical Exam Updated Vital Signs BP (!) 147/111 (BP Location: Left Arm)   Pulse 62   Temp 98.4 F (36.9 C) (Oral)   Resp 16   SpO2 100%   Physical Exam Vitals and nursing note reviewed.  Constitutional:      General: She is not in acute distress.    Appearance: She is well-developed.  HENT:     Head: Atraumatic.  Eyes:     Conjunctiva/sclera: Conjunctivae normal.  Pulmonary:     Effort: Pulmonary effort is normal.  Abdominal:     Palpations: Abdomen is soft.     Tenderness: There is no abdominal tenderness.  Musculoskeletal:         General: Tenderness (Tenderness along lumbar and left lumbosacral region with positive straight leg raise.) present.     Cervical back: Neck supple.     Right lower leg: No edema.     Left lower leg: No edema.  Skin:    Capillary Refill: Capillary refill takes less than 2 seconds.     Findings: No rash.  Neurological:     Mental Status: She is alert.     Comments: Patellar deep tendon reflex intact bilaterally, no foot drops.  Psychiatric:        Mood and Affect: Mood normal.    ED Results / Procedures / Treatments   Labs (all labs ordered are listed, but only abnormal results are displayed) Labs Reviewed - No data to display  EKG None  Radiology No results found.  Procedures Procedures   Medications Ordered in ED Medications - No data to display  ED Course  I have reviewed the triage vital signs and the nursing notes.  Pertinent labs & imaging results that were available during my care of the patient were reviewed by me and considered in my medical decision making (see chart for details).    MDM Rules/Calculators/A&P                          BP (!) 147/111 (BP Location: Left Arm)   Pulse 62   Temp 98.4 F (36.9 C) (Oral)   Resp 16   SpO2 100%   Final Clinical Impression(s) / ED Diagnoses Final diagnoses:  Sciatica, left side    Rx / DC Orders ED Discharge Orders          Ordered    methocarbamol (ROBAXIN) 500 MG tablet  2 times daily        06/15/20 0939    naproxen (NAPROSYN) 500 MG tablet  2 times daily        06/15/20 0939    predniSONE (DELTASONE) 20 MG tablet        06/15/20 0939           9:38 AM Patient here with recurrent radicular left leg pain from her back which has been ongoing issue for nearly 2 months.  She has been seen evaluated for complaint.  In each time she endorsed edema of her lower extremity.  She had a negative DVT study last month when she  was presents with the same symptoms.  She does not have any significant edema on  exam.  Symptom is consistent with sciatica.  No red flags.  She does have an appointment for an MRI in 2 weeks.  At this time will provide symptomatic treatment.  Low suspicion for infectious etiology as well as fracture or dislocation   Domenic Moras, PA-C 06/15/20 0941    Daleen Bo, MD 06/17/20 1521

## 2020-06-19 ENCOUNTER — Other Ambulatory Visit: Payer: Self-pay

## 2020-06-19 ENCOUNTER — Emergency Department (HOSPITAL_COMMUNITY)
Admission: EM | Admit: 2020-06-19 | Discharge: 2020-06-19 | Disposition: A | Discharge status: Home / Self Care | Payer: No Typology Code available for payment source | Attending: Emergency Medicine | Admitting: Emergency Medicine

## 2020-06-19 ENCOUNTER — Emergency Department (HOSPITAL_COMMUNITY): Payer: No Typology Code available for payment source

## 2020-06-19 ENCOUNTER — Encounter (HOSPITAL_COMMUNITY): Payer: Self-pay | Admitting: Emergency Medicine

## 2020-06-19 DIAGNOSIS — M79652 Pain in left thigh: Secondary | ICD-10-CM | POA: Insufficient documentation

## 2020-06-19 DIAGNOSIS — Y92410 Unspecified street and highway as the place of occurrence of the external cause: Secondary | ICD-10-CM | POA: Diagnosis not present

## 2020-06-19 DIAGNOSIS — M5432 Sciatica, left side: Secondary | ICD-10-CM | POA: Insufficient documentation

## 2020-06-19 DIAGNOSIS — I10 Essential (primary) hypertension: Secondary | ICD-10-CM | POA: Diagnosis not present

## 2020-06-19 DIAGNOSIS — Z79899 Other long term (current) drug therapy: Secondary | ICD-10-CM | POA: Diagnosis not present

## 2020-06-19 DIAGNOSIS — M25562 Pain in left knee: Secondary | ICD-10-CM | POA: Insufficient documentation

## 2020-06-19 DIAGNOSIS — Z87891 Personal history of nicotine dependence: Secondary | ICD-10-CM | POA: Insufficient documentation

## 2020-06-19 MED ORDER — MORPHINE SULFATE (PF) 4 MG/ML IV SOLN
4.0000 mg | Freq: Once | INTRAVENOUS | Status: AC
Start: 2020-06-19 — End: 2020-06-19
  Administered 2020-06-19: 4 mg via INTRAMUSCULAR
  Filled 2020-06-19: qty 1
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
## 2020-06-19 NOTE — Discharge Instructions (Addendum)
Can use the crutches for comfort.  You need to follow-up with your primary care doctor to have your blood pressure rechecked as it was elevated today.  Due this as soon as possible.  You can take Tylenol or ibuprofen for pain at home.  Do not take any Goody powders!

## 2020-06-27 DIAGNOSIS — I10 Essential (primary) hypertension: Secondary | ICD-10-CM | POA: Insufficient documentation

## 2020-06-27 NOTE — Progress Notes (Deleted)
Cardiology Office Note   Date:  06/27/2020   ID:  Krista, [name] February 17, 1975, MRN [id number]  PCP:  Vevelyn Francois, NP  Cardiologist:   None Referring:  ***  No chief complaint on file.     History of Present Illness: Krista [name] is a 45 y.o. female who is referred by *** for evaluation of difficult to control HTN.  ***    Past Medical History:  Diagnosis Date   Hypertension    Migraine     No past surgical history on file.   Current Outpatient Medications  Medication Sig Dispense Refill   amLODipine (NORVASC) 10 MG tablet Take 1 tablet (10 mg total) by mouth daily. 90 tablet 3   ferrous sulfate 325 (65 FE) MG tablet Take 1 tablet (325 mg total) by mouth every other day. 60 tablet 5   methocarbamol (ROBAXIN) 500 MG tablet Take 1 tablet (500 mg total) by mouth 2 (two) times daily. 20 tablet 0   naproxen (NAPROSYN) 500 MG tablet Take 1 tablet (500 mg total) by mouth 2 (two) times daily. 30 tablet 0   norethindrone (AYGESTIN) 5 MG tablet Take 1 tablet (5 mg total) by mouth daily. 30 tablet 2   predniSONE (DELTASONE) 20 MG tablet 3 tabs po day one, then 2 tabs daily x 4 days 11 tablet 0   Prenat-Fe Poly-Methfol-FA-DHA (VITAFOL ULTRA) 29-0.6-0.4-200 MG CAPS Take 1 capsule by mouth daily before breakfast. 90 capsule 4   triamterene-hydrochlorothiazide (MAXZIDE) 75-50 MG tablet Take 1 tablet by mouth daily. 90 tablet 3   Current Facility-Administered Medications  Medication Dose Route Frequency Provider Last Rate Last Admin   cloNIDine (CATAPRES) tablet 0.1 mg  0.1 mg Oral Once Vevelyn Francois, NP       cloNIDine (CATAPRES) tablet 0.2 mg  0.2 mg Oral Once Vevelyn Francois, NP        Allergies:   Patient has no known allergies.    Social History:  The patient  reports that she has quit smoking. Her smoking use included cigarettes. She has never used smokeless tobacco. She reports that she does not drink alcohol and does not use drugs.   Family History:  The patient's  ***family history includes Cancer in her mother; Diabetes in her mother; Hypertension in her father and mother.    ROS:  Please see the history of present illness.   Otherwise, review of systems are positive for {NONE DEFAULTED:18576}.   All other systems are reviewed and negative.    PHYSICAL EXAM: VS:  LMP 06/13/2020  , BMI There is no height or weight on file to calculate BMI. GENERAL:  Well appearing HEENT:  Pupils equal round and reactive, fundi not visualized, oral mucosa unremarkable NECK:  No jugular venous distention, waveform within normal limits, carotid upstroke brisk and symmetric, no bruits, no thyromegaly LYMPHATICS:  No cervical, inguinal adenopathy LUNGS:  Clear to auscultation bilaterally BACK:  No CVA tenderness CHEST:  Unremarkable HEART:  PMI not displaced or sustained,S1 and S2 within normal limits, no S3, no S4, no clicks, no rubs, *** murmurs ABD:  Flat, positive bowel sounds normal in frequency in pitch, no bruits, no rebound, no guarding, no midline pulsatile mass, no hepatomegaly, no splenomegaly EXT:  2 plus pulses throughout, no edema, no cyanosis no clubbing SKIN:  No rashes no nodules NEURO:  Cranial nerves II through XII grossly intact, motor grossly intact throughout PSYCH:  Cognitively intact, oriented to person place and time  EKG:  EKG {ACTION; IS/IS IDP:[id number]} ordered today. The ekg ordered today demonstrates ***   Recent Labs: 04/28/2020: BUN 11; Creatinine, Ser 0.91; Hemoglobin 12.3; Platelets 311; Potassium 3.8; Sodium 140    Lipid Panel    Component Value Date/Time   CHOL 150 04/01/2020 1135   TRIG 41 04/01/2020 1135   HDL 39 (L) 04/01/2020 1135   CHOLHDL 3.8 04/01/2020 1135   LDLCALC 102 (H) 04/01/2020 1135      Wt Readings from Last 3 Encounters:  06/19/20 160 lb (72.6 kg)  05/26/20 161 lb 1.6 oz (73.1 kg)  04/30/20 160 lb 6.4 oz (72.8 kg)      Other studies Reviewed: Additional studies/ records that were reviewed today  include: ***. Review of the above records demonstrates:  Please see elsewhere in the note.  ***   ASSESSMENT AND PLAN:  HTN:  ***   Current medicines are reviewed at length with the patient today.  The patient {ACTIONS; HAS/DOES NOT HAVE:19233} concerns regarding medicines.  The following changes have been made:  {PLAN; NO CHANGE:13088:s}  Labs/ tests ordered today include: *** No orders of the defined types were placed in this encounter.    Disposition:   FU with ***    Signed, Minus Breeding, MD  06/27/2020 11:36 AM    [facility]

## 2020-06-28 ENCOUNTER — Ambulatory Visit: Payer: Self-pay | Admitting: Cardiology

## 2020-07-01 ENCOUNTER — Ambulatory Visit (HOSPITAL_COMMUNITY): Payer: No Typology Code available for payment source | Admitting: Certified Registered Nurse Anesthetist

## 2020-07-01 ENCOUNTER — Encounter (HOSPITAL_COMMUNITY): Payer: Self-pay | Admitting: Orthopedic Surgery

## 2020-07-01 ENCOUNTER — Ambulatory Visit (HOSPITAL_COMMUNITY): Payer: No Typology Code available for payment source

## 2020-07-01 ENCOUNTER — Ambulatory Visit (HOSPITAL_COMMUNITY)
Admission: RE | Admit: 2020-07-01 | Discharge: 2020-07-01 | Disposition: A | Discharge status: Home / Self Care | Payer: Self-pay | Source: Ambulatory Visit | Attending: Orthopedic Surgery | Admitting: Orthopedic Surgery

## 2020-07-01 ENCOUNTER — Ambulatory Visit (HOSPITAL_COMMUNITY)
Admission: RE | Disposition: A | Discharge status: Home / Self Care | Payer: Self-pay | Source: Ambulatory Visit | Attending: Orthopedic Surgery

## 2020-07-01 ENCOUNTER — Other Ambulatory Visit: Payer: Self-pay

## 2020-07-01 DIAGNOSIS — Z793 Long term (current) use of hormonal contraceptives: Secondary | ICD-10-CM | POA: Insufficient documentation

## 2020-07-01 DIAGNOSIS — M5117 Intervertebral disc disorders with radiculopathy, lumbosacral region: Secondary | ICD-10-CM | POA: Insufficient documentation

## 2020-07-01 DIAGNOSIS — Z20822 Contact with and (suspected) exposure to covid-19: Secondary | ICD-10-CM | POA: Insufficient documentation

## 2020-07-01 DIAGNOSIS — I10 Essential (primary) hypertension: Secondary | ICD-10-CM | POA: Insufficient documentation

## 2020-07-01 DIAGNOSIS — Z791 Long term (current) use of non-steroidal anti-inflammatories (NSAID): Secondary | ICD-10-CM | POA: Insufficient documentation

## 2020-07-01 DIAGNOSIS — Z7952 Long term (current) use of systemic steroids: Secondary | ICD-10-CM | POA: Insufficient documentation

## 2020-07-01 DIAGNOSIS — Z87891 Personal history of nicotine dependence: Secondary | ICD-10-CM | POA: Insufficient documentation

## 2020-07-01 DIAGNOSIS — Z79899 Other long term (current) drug therapy: Secondary | ICD-10-CM | POA: Insufficient documentation

## 2020-07-01 DIAGNOSIS — Z419 Encounter for procedure for purposes other than remedying health state, unspecified: Secondary | ICD-10-CM

## 2020-07-01 HISTORY — PX: LUMBAR LAMINECTOMY/DECOMPRESSION MICRODISCECTOMY: SHX5026

## 2020-07-01 LAB — CBC
HCT: 36.4 % (ref 36.0–46.0)
Hemoglobin: 12 g/dL (ref 12.0–15.0)
MCH: 30.7 pg (ref 26.0–34.0)
MCHC: 33 g/dL (ref 30.0–36.0)
MCV: 93.1 fL (ref 80.0–100.0)
Platelets: 267 10*3/uL (ref 150–400)
RBC: 3.91 MIL/uL (ref 3.87–5.11)
RDW: 14.8 % (ref 11.5–15.5)
WBC: 4.3 10*3/uL (ref 4.0–10.5)
nRBC: 0 % (ref 0.0–0.2)

## 2020-07-01 LAB — SARS CORONAVIRUS 2 BY RT PCR (HOSPITAL ORDER, PERFORMED IN ~~LOC~~ HOSPITAL LAB): SARS Coronavirus 2: NEGATIVE

## 2020-07-01 LAB — BASIC METABOLIC PANEL
Anion gap: 5 (ref 5–15)
BUN: 11 mg/dL (ref 6–20)
CO2: 26 mmol/L (ref 22–32)
Calcium: 9 mg/dL (ref 8.9–10.3)
Chloride: 108 mmol/L (ref 98–111)
Creatinine, Ser: 0.9 mg/dL (ref 0.44–1.00)
GFR, Estimated: >60 mL/min (ref >60)
Glucose, Bld: 93 mg/dL (ref 70–99)
Potassium: 3.7 mmol/L (ref 3.5–5.1)
Sodium: 139 mmol/L (ref 135–145)

## 2020-07-01 LAB — PROTIME-INR
INR: 1.1 (ref 0.8–1.2)
Prothrombin Time: 13.7 seconds (ref 11.4–15.2)

## 2020-07-01 LAB — SURGICAL PCR SCREEN
MRSA, PCR: NEGATIVE
Staphylococcus aureus: NEGATIVE

## 2020-07-01 LAB — POCT PREGNANCY, URINE: Preg Test, Ur: NEGATIVE

## 2020-07-01 LAB — APTT: aPTT: 30 seconds (ref 24–36)

## 2020-07-01 SURGERY — LUMBAR LAMINECTOMY/DECOMPRESSION MICRODISCECTOMY 1 LEVEL
Anesthesia: General

## 2020-07-01 MED ORDER — AMISULPRIDE (ANTIEMETIC) 5 MG/2ML IV SOLN
INTRAVENOUS | Status: AC
  Filled 2020-07-01: qty 4

## 2020-07-01 MED ORDER — MIDAZOLAM HCL 5 MG/5ML IJ SOLN
INTRAMUSCULAR | Status: DC | PRN
  Administered 2020-07-01: 2 mg via INTRAVENOUS

## 2020-07-01 MED ORDER — DEXAMETHASONE SODIUM PHOSPHATE 10 MG/ML IJ SOLN
INTRAMUSCULAR | Status: DC | PRN
  Administered 2020-07-01: 4 mg via INTRAVENOUS

## 2020-07-01 MED ORDER — PROPOFOL 10 MG/ML IV BOLUS
INTRAVENOUS | Status: AC
  Filled 2020-07-01: qty 20

## 2020-07-01 MED ORDER — ACETAMINOPHEN 10 MG/ML IV SOLN
INTRAVENOUS | Status: DC | PRN
  Administered 2020-07-01: 1000 mg via INTRAVENOUS

## 2020-07-01 MED ORDER — LACTATED RINGERS IV SOLN: INTRAVENOUS | Status: DC

## 2020-07-01 MED ORDER — FENTANYL CITRATE (PF) 250 MCG/5ML IJ SOLN
INTRAMUSCULAR | Status: DC | PRN
  Administered 2020-07-01: 100 ug via INTRAVENOUS
  Administered 2020-07-01: 50 ug via INTRAVENOUS

## 2020-07-01 MED ORDER — HEMOSTATIC AGENTS (NO CHARGE) OPTIME
TOPICAL | Status: DC | PRN
  Administered 2020-07-01: 1 via TOPICAL

## 2020-07-01 MED ORDER — DEXAMETHASONE SODIUM PHOSPHATE 10 MG/ML IJ SOLN
INTRAMUSCULAR | Status: AC
  Filled 2020-07-01: qty 1

## 2020-07-01 MED ORDER — CEFAZOLIN SODIUM-DEXTROSE 2-4 GM/100ML-% IV SOLN
2.0000 g | INTRAVENOUS | Status: AC
  Administered 2020-07-01: 2 g via INTRAVENOUS
  Filled 2020-07-01: qty 100

## 2020-07-01 MED ORDER — BUPIVACAINE LIPOSOME 1.3 % IJ SUSP
INTRAMUSCULAR | Status: DC | PRN
  Administered 2020-07-01: 20 mL

## 2020-07-01 MED ORDER — HYDROMORPHONE HCL 1 MG/ML IJ SOLN: 0.2500 mg | INTRAMUSCULAR | Status: DC | PRN

## 2020-07-01 MED ORDER — OXYCODONE-ACETAMINOPHEN 10-325 MG PO TABS: 1.0000 | ORAL_TABLET | Freq: Four times a day (QID) | ORAL | 0 refills | Status: AC | PRN

## 2020-07-01 MED ORDER — PROPOFOL 10 MG/ML IV BOLUS
INTRAVENOUS | Status: DC | PRN
  Administered 2020-07-01: 60 mg via INTRAVENOUS
  Administered 2020-07-01: 140 mg via INTRAVENOUS

## 2020-07-01 MED ORDER — PROMETHAZINE HCL 25 MG/ML IJ SOLN: 6.2500 mg | INTRAMUSCULAR | Status: DC | PRN

## 2020-07-01 MED ORDER — METHYLPREDNISOLONE ACETATE 40 MG/ML INJ SUSP (RADIOLOG
INTRAMUSCULAR | Status: DC | PRN
  Administered 2020-07-01: 40 mg via INTRA_ARTICULAR

## 2020-07-01 MED ORDER — THROMBIN 20000 UNITS EX SOLR
CUTANEOUS | Status: DC | PRN
  Administered 2020-07-01: 20 mL via TOPICAL

## 2020-07-01 MED ORDER — SCOPOLAMINE 1 MG/3DAYS TD PT72: 1.0000 | MEDICATED_PATCH | TRANSDERMAL | Status: DC

## 2020-07-01 MED ORDER — ACETAMINOPHEN 160 MG/5ML PO SOLN: 325.0000 mg | Freq: Once | ORAL | Status: DC | PRN

## 2020-07-01 MED ORDER — AMISULPRIDE (ANTIEMETIC) 5 MG/2ML IV SOLN
10.0000 mg | Freq: Once | INTRAVENOUS | Status: AC | PRN
  Administered 2020-07-01: 10 mg via INTRAVENOUS

## 2020-07-01 MED ORDER — CHLORHEXIDINE GLUCONATE 0.12 % MT SOLN
15.0000 mL | OROMUCOSAL | Status: AC
  Filled 2020-07-01: qty 15

## 2020-07-01 MED ORDER — LIDOCAINE 2% (20 MG/ML) 5 ML SYRINGE
INTRAMUSCULAR | Status: DC | PRN
  Administered 2020-07-01: 40 mg via INTRAVENOUS

## 2020-07-01 MED ORDER — MIDAZOLAM HCL 2 MG/2ML IJ SOLN
INTRAMUSCULAR | Status: AC
  Filled 2020-07-01: qty 2

## 2020-07-01 MED ORDER — PHENYLEPHRINE 40 MCG/ML (10ML) SYRINGE FOR IV PUSH (FOR BLOOD PRESSURE SUPPORT)
PREFILLED_SYRINGE | INTRAVENOUS | Status: AC
  Filled 2020-07-01: qty 10

## 2020-07-01 MED ORDER — ONDANSETRON HCL 4 MG/2ML IJ SOLN
INTRAMUSCULAR | Status: AC
  Filled 2020-07-01: qty 2

## 2020-07-01 MED ORDER — METHOCARBAMOL 500 MG PO TABS: 500.0000 mg | ORAL_TABLET | Freq: Three times a day (TID) | ORAL | 0 refills | Status: AC | PRN

## 2020-07-01 MED ORDER — FENTANYL CITRATE (PF) 250 MCG/5ML IJ SOLN
INTRAMUSCULAR | Status: AC
  Filled 2020-07-01: qty 5

## 2020-07-01 MED ORDER — 0.9 % SODIUM CHLORIDE (POUR BTL) OPTIME
TOPICAL | Status: DC | PRN
  Administered 2020-07-01: 1000 mL

## 2020-07-01 MED ORDER — BUPIVACAINE-EPINEPHRINE 0.25% -1:200000 IJ SOLN
INTRAMUSCULAR | Status: DC | PRN
  Administered 2020-07-01: 20 mL
  Administered 2020-07-01: 10 mL

## 2020-07-01 MED ORDER — ONDANSETRON HCL 4 MG PO TABS: 4.0000 mg | ORAL_TABLET | Freq: Three times a day (TID) | ORAL | 0 refills | Status: DC | PRN

## 2020-07-01 MED ORDER — BUPIVACAINE LIPOSOME 1.3 % IJ SUSP
INTRAMUSCULAR | Status: AC
  Filled 2020-07-01: qty 20

## 2020-07-01 MED ORDER — SUGAMMADEX SODIUM 200 MG/2ML IV SOLN
INTRAVENOUS | Status: DC | PRN
  Administered 2020-07-01: 200 mg via INTRAVENOUS

## 2020-07-01 MED ORDER — MEPERIDINE HCL 25 MG/ML IJ SOLN: 6.2500 mg | INTRAMUSCULAR | Status: DC | PRN

## 2020-07-01 MED ORDER — THROMBIN (RECOMBINANT) 20000 UNITS EX SOLR
CUTANEOUS | Status: AC
  Filled 2020-07-01: qty 20000

## 2020-07-01 MED ORDER — ACETAMINOPHEN 10 MG/ML IV SOLN
INTRAVENOUS | Status: AC
  Filled 2020-07-01: qty 100

## 2020-07-01 MED ORDER — ACETAMINOPHEN 10 MG/ML IV SOLN: 1000.0000 mg | Freq: Once | INTRAVENOUS | Status: DC | PRN

## 2020-07-01 MED ORDER — ONDANSETRON HCL 4 MG/2ML IJ SOLN
INTRAMUSCULAR | Status: DC | PRN
  Administered 2020-07-01: 4 mg via INTRAVENOUS

## 2020-07-01 MED ORDER — BUPIVACAINE-EPINEPHRINE (PF) 0.25% -1:200000 IJ SOLN
INTRAMUSCULAR | Status: AC
  Filled 2020-07-01: qty 30

## 2020-07-01 MED ORDER — CHLORHEXIDINE GLUCONATE 0.12 % MT SOLN
OROMUCOSAL | Status: AC
  Administered 2020-07-01: 15 mL via OROMUCOSAL
  Filled 2020-07-01: qty 15

## 2020-07-01 MED ORDER — ROCURONIUM BROMIDE 10 MG/ML (PF) SYRINGE
PREFILLED_SYRINGE | INTRAVENOUS | Status: AC
  Filled 2020-07-01: qty 10

## 2020-07-01 MED ORDER — PHENYLEPHRINE 40 MCG/ML (10ML) SYRINGE FOR IV PUSH (FOR BLOOD PRESSURE SUPPORT)
PREFILLED_SYRINGE | INTRAVENOUS | Status: DC | PRN
  Administered 2020-07-01: 40 ug via INTRAVENOUS

## 2020-07-01 MED ORDER — TRANEXAMIC ACID-NACL 1000-0.7 MG/100ML-% IV SOLN
1000.0000 mg | INTRAVENOUS | Status: AC
  Administered 2020-07-01: 1000 mg via INTRAVENOUS
  Filled 2020-07-01: qty 100

## 2020-07-01 MED ORDER — ACETAMINOPHEN 325 MG PO TABS: 325.0000 mg | ORAL_TABLET | Freq: Once | ORAL | Status: DC | PRN

## 2020-07-01 MED ORDER — LIDOCAINE 2% (20 MG/ML) 5 ML SYRINGE
INTRAMUSCULAR | Status: AC
  Filled 2020-07-01: qty 5

## 2020-07-01 MED ORDER — METHYLPREDNISOLONE ACETATE 40 MG/ML IJ SUSP
INTRAMUSCULAR | Status: AC
  Filled 2020-07-01: qty 1

## 2020-07-01 MED ORDER — ROCURONIUM BROMIDE 10 MG/ML (PF) SYRINGE
PREFILLED_SYRINGE | INTRAVENOUS | Status: DC | PRN
  Administered 2020-07-01: 60 mg via INTRAVENOUS

## 2020-07-01 SURGICAL SUPPLY — 53 items
BAG COUNTER SPONGE SURGICOUNT (BAG) ×2 IMPLANT
BNDG GAUZE ELAST 4 BULKY (GAUZE/BANDAGES/DRESSINGS) ×4 IMPLANT
BUR EGG ELITE 4.0 (BURR) IMPLANT
BUR MATCHSTICK NEURO 3.0 LAGG (BURR) IMPLANT
CANISTER SUCT 3000ML PPV (MISCELLANEOUS) ×2 IMPLANT
CLSR STERI-STRIP ANTIMIC 1/2X4 (GAUZE/BANDAGES/DRESSINGS) ×2 IMPLANT
CORD BIPOLAR FORCEPS 12FT (ELECTRODE) ×2 IMPLANT
COVER SURGICAL LIGHT HANDLE (MISCELLANEOUS) ×2 IMPLANT
DRAIN CHANNEL 15F RND FF W/TCR (WOUND CARE) IMPLANT
DRAPE POUCH INSTRU U-SHP 10X18 (DRAPES) ×2 IMPLANT
DRAPE SURG 17X23 STRL (DRAPES) ×2 IMPLANT
DRAPE U-SHAPE 47X51 STRL (DRAPES) ×2 IMPLANT
DRSG OPSITE POSTOP 3X4 (GAUZE/BANDAGES/DRESSINGS) ×2 IMPLANT
DURAPREP 26ML APPLICATOR (WOUND CARE) ×2 IMPLANT
ELECT BLADE 4.0 EZ CLEAN MEGAD (MISCELLANEOUS)
ELECT CAUTERY BLADE 6.4 (BLADE) ×2 IMPLANT
ELECT PENCIL ROCKER SW 15FT (MISCELLANEOUS) ×2 IMPLANT
ELECT REM PT RETURN 9FT ADLT (ELECTROSURGICAL) ×2
ELECTRODE BLDE 4.0 EZ CLN MEGD (MISCELLANEOUS) IMPLANT
ELECTRODE REM PT RTRN 9FT ADLT (ELECTROSURGICAL) ×1 IMPLANT
EVACUATOR SILICONE 100CC (DRAIN) IMPLANT
GLOVE SURG ENC MOIS LTX SZ6.5 (GLOVE) ×2 IMPLANT
GLOVE SURG MICRO LTX SZ8.5 (GLOVE) ×2 IMPLANT
GLOVE SURG UNDER POLY LF SZ6.5 (GLOVE) ×2 IMPLANT
GLOVE SURG UNDER POLY LF SZ8.5 (GLOVE) ×2 IMPLANT
GOWN STRL REUS W/ TWL LRG LVL3 (GOWN DISPOSABLE) ×2 IMPLANT
GOWN STRL REUS W/TWL 2XL LVL3 (GOWN DISPOSABLE) ×2 IMPLANT
GOWN STRL REUS W/TWL LRG LVL3 (GOWN DISPOSABLE) ×2
KIT BASIN OR (CUSTOM PROCEDURE TRAY) ×2 IMPLANT
KIT TURNOVER KIT B (KITS) ×2 IMPLANT
NEEDLE 22X1 1/2 (OR ONLY) (NEEDLE) ×2 IMPLANT
NEEDLE SPNL 18GX3.5 QUINCKE PK (NEEDLE) ×4 IMPLANT
NS IRRIG 1000ML POUR BTL (IV SOLUTION) ×2 IMPLANT
PACK LAMINECTOMY ORTHO (CUSTOM PROCEDURE TRAY) ×2 IMPLANT
PACK UNIVERSAL I (CUSTOM PROCEDURE TRAY) ×2 IMPLANT
PAD ARMBOARD 7.5X6 YLW CONV (MISCELLANEOUS) ×4 IMPLANT
PATTIES SURGICAL .5 X.5 (GAUZE/BANDAGES/DRESSINGS) ×2 IMPLANT
PATTIES SURGICAL .5 X1 (DISPOSABLE) ×2 IMPLANT
SPONGE SURGIFOAM ABS GEL 100 (HEMOSTASIS) IMPLANT
SURGIFLO W/THROMBIN 8M KIT (HEMOSTASIS) IMPLANT
SUT BONE WAX W31G (SUTURE) ×2 IMPLANT
SUT MNCRL AB 3-0 PS2 27 (SUTURE) ×2 IMPLANT
SUT VIC AB 0 CT1 27 (SUTURE)
SUT VIC AB 0 CT1 27XBRD ANBCTR (SUTURE) IMPLANT
SUT VIC AB 1 CT1 18XCR BRD 8 (SUTURE) ×1 IMPLANT
SUT VIC AB 1 CT1 8-18 (SUTURE) ×1
SUT VIC AB 2-0 CT1 18 (SUTURE) ×2 IMPLANT
SYR BULB IRRIG 60ML STRL (SYRINGE) ×2 IMPLANT
SYR CONTROL 10ML LL (SYRINGE) ×2 IMPLANT
TOWEL GREEN STERILE (TOWEL DISPOSABLE) ×2 IMPLANT
TOWEL GREEN STERILE FF (TOWEL DISPOSABLE) ×2 IMPLANT
WATER STERILE IRR 1000ML POUR (IV SOLUTION) ×2 IMPLANT
YANKAUER SUCT BULB TIP NO VENT (SUCTIONS) IMPLANT

## 2020-07-01 NOTE — Discharge Instructions (Signed)

## 2020-07-01 NOTE — Op Note (Signed)
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 female who presented to my office with complaints of severe back and radicular left leg pain.  Imaging studies confirmed a large posterior lateral disc herniation at L5-S1 with not only S1 but also S2 neural compression.  Over the past week her symptoms have progressed and she developed neurological deficits consisting of now motor weakness and progressive intense dysesthesias in the S1 dermatome.  As result of the progression of her symptoms I instructed the patient to present to the hospital this morning so that we can move forward with an urgent discectomy.  All appropriate risks, benefits, alternatives to surgery were discussed with the patient and consent was obtained.  PROCEDURE DETAILS: Patient was brought into the operating room. After successful induction of general anesthesia and endotracheal intubation a Time Out was done. This confirmed all pertinent important data.  Patient was then turned prone onto the Wilson frame and the back was prepped and draped in a standard fashion.  2 needles were placed into the back and an x-ray was taken for localization of the incision.  The incision site was then infiltrated with quarter percent Marcaine with epinephrine and a midline incision was made centered over the L5-S1 disc space.  Sharp dissection was carried out down to the deep fascia.  I exposed the deep fascia and then injected quarter percent Marcaine with Exparel into the paraspinal muscles bilaterally to aid in postoperative  analgesia and intraoperative hemostasis.  I then incised the deep fascia and stripped him to expose the L5 and S1 spinous processes and the L5 lamina as well as the L5-S1 facet complex.  I then placed a Penfield 4 underneath the L5 lamina and took a second x-ray.  Once I confirmed I was at the appropriate level I placed my Taylor retractor on the lateral side of the facet joint to expose the posterior lateral aspect of the bony spine.  A laminotomy was performed with a 3 mm Kerrison rongeur.  Penfield 4 was used to dissect through the ligamentum flavum until was able to create a plane between the ligamentum flavum and the thecal sac.  I resected the ligamentum flavum with my 2 and 3 mm Kerrison rongeur.  At this point the S1 nerve root became visible.  It was noted to be significantly dorsally displaced and erythematous consistent with significant compression.  In order to adequately see the disc fragment I had to perform a medial facetectomy.  Using a Penfield 4 I gently dissected into the lateral recess and then resected the medial aspect of the L5-S1 facet complex.  At this point I was able to get around the lateral side of the S1 nerve root.  Using my micro nerve hook I was able to mobilize the disc fragment and then remove 3 very large fragments of disc material.  Collectively they did resemble the same size as the disc herniation seen on the preoperative MRI.  At this point time the S1 nerve root was now freely mobile.  It was no longer dorsally displaced and the contour was  now more normal.  In order to confirm satisfactory decompression and S1 medial foraminotomy was performed.  I then continued to sweep circumferentially underneath the thecal sac removing smaller fragments of disc material.  I then used my Epstein curette to debride the remaining portion of the annulus.  At this point time I could freely pass my [facility] elevator superiorly and inferiorly as well as medially underneath the thecal sac and  along the course of the S1 nerve root out into the S1 foramen.  It was no longer under compression and I removed all the disc fragments.  With the discectomy/decompression complete I used my Bovie electrocautery to obtain hemostasis.  After copious irrigation I did place approximately 20 mg (1/2 cc) of Depo-Medrol to aid in postoperative analgesia. Factors were removed and a thrombin-soaked Gelfoam patty was placed over the laminotomy defect.  I then closed the deep fascia with interrupted #1 Vicryl sutures, superficial 2-0 Vicryl suture, and 3-0 Monocryl for the skin.  Steri-Strips and dry dressings were applied and the patient was ultimately extubated transfer the PACU without incident.  The end of the case all needle and sponge counts were correct.   Melina Schools, MD 07/01/2020 12:55 PM

## 2020-07-01 NOTE — OR Nursing (Addendum)
Dr. Corie Chiquito called and confirmed operative level Radiologist.

## 2020-07-01 NOTE — Transfer of Care (Signed)
Immediate Anesthesia Transfer of Care Note  Patient: Krista [name]  Procedure(s) Performed: LUMBAR LAMINECTOMY/DECOMPRESSION MICRODISCECTOMY 1 LEVEL  Patient Location: PACU  Anesthesia Type:General  Level of Consciousness: drowsy and patient cooperative  Airway & Oxygen Therapy: Patient Spontanous Breathing and Patient connected to nasal cannula oxygen  Post-op Assessment: Report given to RN and Post -op Vital signs reviewed and stable  Post vital signs: Reviewed and stable  Last Vitals:  Vitals Value Taken Time  BP 142/97 07/01/20 1329  Temp 36.5 C 07/01/20 1329  Pulse 77 07/01/20 1331  Resp 20 07/01/20 1331  SpO2 100 % 07/01/20 1331  Vitals shown include unvalidated device data.  Last Pain:  Vitals:   07/01/20 0748  TempSrc:   PainSc: 8       Patients Stated Pain Goal: 5 (28/83/37 4451)  Complications: No notable events documented.

## 2020-07-01 NOTE — Anesthesia Postprocedure Evaluation (Signed)
Anesthesia Post Note  Patient: Krista [name]  Procedure(s) Performed: LUMBAR LAMINECTOMY/DECOMPRESSION MICRODISCECTOMY 1 LEVEL     Patient location during evaluation: PACU Anesthesia Type: General Level of consciousness: awake and alert Pain management: pain level controlled Vital Signs Assessment: post-procedure vital signs reviewed and stable Respiratory status: spontaneous breathing, nonlabored ventilation, respiratory function stable and patient connected to nasal cannula oxygen Cardiovascular status: blood pressure returned to baseline and stable Postop Assessment: no apparent nausea or vomiting Anesthetic complications: no   No notable events documented.  Last Vitals:  Vitals:   07/01/20 1430 07/01/20 1445  BP: (!) 151/100 (!) 148/98  Pulse: 60 (!) 56  Resp: (!) 21 12  Temp:  36.4 C  SpO2: 100% 100%    Last Pain:  Vitals:   07/01/20 1445  TempSrc:   PainSc: 0-No pain                 Effie Berkshire
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
## 2020-07-01 NOTE — Anesthesia Procedure Notes (Signed)
Procedure Name: Intubation Date/Time: 07/01/2020 11:31 AM Performed by: Colin Benton, CRNA Pre-anesthesia Checklist: Patient identified, Emergency Drugs available, Suction available and Patient being monitored Patient Re-evaluated:Patient Re-evaluated prior to induction Oxygen Delivery Method: Circle system utilized Preoxygenation: Pre-oxygenation with 100% oxygen Induction Type: IV induction Ventilation: Mask ventilation without difficulty Laryngoscope Size: Miller and 2 Grade View: Grade I Tube type: Oral Tube size: 7.0 mm Number of attempts: 1 Airway Equipment and Method: Stylet and Oral airway Placement Confirmation: ETT inserted through vocal cords under direct vision, positive ETCO2 and breath sounds checked- equal and bilateral Secured at: 22 cm Tube secured with: Tape Dental Injury: Teeth and Oropharynx as per pre-operative assessment

## 2020-07-01 NOTE — Anesthesia Preprocedure Evaluation (Addendum)
Anesthesia Evaluation  Patient identified by MRN, date of birth, ID band Patient awake    Reviewed: Allergy & Precautions, NPO status , Patient's Chart, lab work & pertinent test results  Airway Mallampati: II  TM Distance: >3 FB Neck ROM: Full    Dental  (+) Dental Advisory Given, Missing,    Pulmonary former smoker,    breath sounds clear to auscultation       Cardiovascular hypertension, Pt. on medications  Rhythm:Regular Rate:Normal     Neuro/Psych  Headaches, negative psych ROS   GI/Hepatic negative GI ROS, Neg liver ROS,   Endo/Other  negative endocrine ROS  Renal/GU negative Renal ROS     Musculoskeletal negative musculoskeletal ROS (+)   Abdominal Normal abdominal exam  (+)   Peds  Hematology negative hematology ROS (+)   Anesthesia Other Findings   Reproductive/Obstetrics                            Anesthesia Physical Anesthesia Plan  ASA: 2  Anesthesia Plan: General   Post-op Pain Management:    Induction: Intravenous  PONV Risk Score and Plan: 4 or greater and Ondansetron, Dexamethasone, Midazolam and Scopolamine patch - Pre-op  Airway Management Planned: Oral ETT  Additional Equipment: None  Intra-op Plan:   Post-operative Plan: Extubation in OR  Informed Consent: I have reviewed the patients History and Physical, chart, labs and discussed the procedure including the risks, benefits and alternatives for the proposed anesthesia with the patient or authorized representative who has indicated his/her understanding and acceptance.     Dental advisory given  Plan Discussed with: CRNA  Anesthesia Plan Comments:        Anesthesia Quick Evaluation

## 2020-07-01 NOTE — Brief Op Note (Signed)
07/01/2020  1:03 PM  PATIENT:  Krista [name]  45 y.o. female  PRE-OPERATIVE DIAGNOSIS:  lumbar five sacral one compression  POST-OPERATIVE DIAGNOSIS:  lumbar five sacral one compression  PROCEDURE:  Procedure(s): LUMBAR LAMINECTOMY/DECOMPRESSION MICRODISCECTOMY 1 LEVEL (N/A)  SURGEON:  Surgeon(s) and Role:    Melina Schools, MD - Primary  PHYSICIAN ASSISTANT:   ASSISTANTS: Nelson Chimes, PA  ANESTHESIA:   general  EBL:  50 mL   BLOOD ADMINISTERED:none  DRAINS: none   LOCAL MEDICATIONS USED:  MARCAINE    and OTHER Depo-Medrol  SPECIMEN:  No Specimen  DISPOSITION OF SPECIMEN:  N/A  COUNTS:  YES  TOURNIQUET:  * No tourniquets in log *  DICTATION: .Dragon Dictation  PLAN OF CARE: Discharge to home after PACU  PATIENT DISPOSITION:  PACU - hemodynamically stable.

## 2020-07-02 ENCOUNTER — Encounter (HOSPITAL_COMMUNITY): Payer: Self-pay | Admitting: Orthopedic Surgery

## 2020-07-02 MED FILL — Thrombin (Recombinant) For Soln 20000 Unit: CUTANEOUS | Qty: 1 | Status: AC

## 2020-07-12 ENCOUNTER — Ambulatory Visit: Payer: Medicaid Other | Admitting: Nurse Practitioner

## 2020-07-13 ENCOUNTER — Encounter (HOSPITAL_COMMUNITY): Admission: RE | Payer: Self-pay | Source: Home / Self Care

## 2020-07-13 ENCOUNTER — Ambulatory Visit (HOSPITAL_COMMUNITY)
Admission: RE | Admit: 2020-07-13 | Payer: Medicaid Other | Source: Home / Self Care | Admitting: Obstetrics and Gynecology

## 2020-07-13 SURGERY — HYSTERECTOMY, VAGINAL
Anesthesia: Choice | Laterality: Bilateral

## 2020-07-15 ENCOUNTER — Telehealth: Payer: Self-pay

## 2020-07-16 NOTE — Telephone Encounter (Signed)
error 

## 2021-09-11 ENCOUNTER — Emergency Department (HOSPITAL_COMMUNITY): Payer: No Typology Code available for payment source

## 2021-09-11 ENCOUNTER — Emergency Department (HOSPITAL_COMMUNITY)
Admission: EM | Admit: 2021-09-11 | Discharge: 2021-09-11 | Discharge status: Against Medical Advice | Payer: No Typology Code available for payment source | Attending: Emergency Medicine | Admitting: Emergency Medicine

## 2021-09-11 ENCOUNTER — Encounter (HOSPITAL_COMMUNITY): Payer: Self-pay | Admitting: Emergency Medicine

## 2021-09-11 ENCOUNTER — Other Ambulatory Visit: Payer: Self-pay

## 2021-09-11 DIAGNOSIS — R0602 Shortness of breath: Secondary | ICD-10-CM | POA: Diagnosis not present

## 2021-09-11 DIAGNOSIS — Z5321 Procedure and treatment not carried out due to patient leaving prior to being seen by health care provider: Secondary | ICD-10-CM | POA: Diagnosis not present

## 2021-09-11 DIAGNOSIS — R059 Cough, unspecified: Secondary | ICD-10-CM | POA: Diagnosis not present

## 2021-09-11 DIAGNOSIS — R079 Chest pain, unspecified: Secondary | ICD-10-CM | POA: Insufficient documentation

## 2021-09-11 LAB — I-STAT BETA HCG BLOOD, ED (MC, WL, AP ONLY): I-stat hCG, quantitative: <5 m[IU]/mL (ref <5)

## 2021-09-11 LAB — CBC
HCT: 38.5 % (ref 36.0–46.0)
Hemoglobin: 12.2 g/dL (ref 12.0–15.0)
MCH: 29.1 pg (ref 26.0–34.0)
MCHC: 31.7 g/dL (ref 30.0–36.0)
MCV: 91.9 fL (ref 80.0–100.0)
Platelets: 272 10*3/uL (ref 150–400)
RBC: 4.19 MIL/uL (ref 3.87–5.11)
RDW: 13.8 % (ref 11.5–15.5)
WBC: 5.8 10*3/uL (ref 4.0–10.5)
nRBC: 0 % (ref 0.0–0.2)

## 2021-09-11 LAB — BASIC METABOLIC PANEL
Anion gap: 8 (ref 5–15)
BUN: 12 mg/dL (ref 6–20)
CO2: 23 mmol/L (ref 22–32)
Calcium: 9.2 mg/dL (ref 8.9–10.3)
Chloride: 107 mmol/L (ref 98–111)
Creatinine, Ser: 0.96 mg/dL (ref 0.44–1.00)
GFR, Estimated: >60 mL/min (ref >60)
Glucose, Bld: 91 mg/dL (ref 70–99)
Potassium: 3.8 mmol/L (ref 3.5–5.1)
Sodium: 138 mmol/L (ref 135–145)

## 2021-09-11 LAB — TROPONIN I (HIGH SENSITIVITY): Troponin I (High Sensitivity): 11 ng/L (ref <18)

## 2021-09-11 NOTE — ED Triage Notes (Signed)
Patient reports central chest pain with SOB and dry cough onset yesterday , she received ASA 324 mg and 1 NTG sl by EMS with mild relief . No emesis or diaphoresis .

## 2021-09-11 NOTE — ED Notes (Signed)
Pt stated she was leaving AMA due to wait 

## 2021-09-28 ENCOUNTER — Inpatient Hospital Stay: Payer: Self-pay | Admitting: Nurse Practitioner

## 2021-09-29 ENCOUNTER — Other Ambulatory Visit: Payer: Self-pay

## 2021-09-29 ENCOUNTER — Ambulatory Visit (INDEPENDENT_AMBULATORY_CARE_PROVIDER_SITE_OTHER): Payer: No Typology Code available for payment source | Admitting: Nurse Practitioner

## 2021-09-29 ENCOUNTER — Ambulatory Visit: Payer: Self-pay

## 2021-09-29 DIAGNOSIS — I10 Essential (primary) hypertension: Secondary | ICD-10-CM | POA: Diagnosis not present

## 2021-09-29 MED ORDER — AMLODIPINE BESYLATE 10 MG PO TABS
10.0000 mg | ORAL_TABLET | Freq: Every day | ORAL | 3 refills | Status: DC
  Filled 2021-09-29: qty 30, 30d supply, fill #0

## 2021-09-29 MED ORDER — TRIAMTERENE-HCTZ 75-50 MG PO TABS
1.0000 | ORAL_TABLET | Freq: Every day | ORAL | 3 refills | Status: DC
  Filled 2021-09-29: qty 30, 30d supply, fill #0

## 2021-09-29 NOTE — Patient Instructions (Addendum)
1. HTN (hypertension), benign  - triamterene-hydrochlorothiazide (MAXZIDE) 75-50 MG tablet; Take 1 tablet by mouth daily.  Dispense: 90 tablet; Refill: 3  - amLODipine (NORVASC) 10 MG tablet; Take 1 tablet (10 mg total) by mouth daily.  Dispense: 90 tablet; Refill: 3  - AMB Referral to Pharmacy Medication Management   Follow up:  Please follow up tomorrow after getting medications for blood pressure recheck

## 2021-09-29 NOTE — Progress Notes (Signed)
$'@Patient'd$  ID: Krista [name], female    DOB: 25-Dec-1975, 46 y.o.   MRN: [id number]  Chief Complaint  Patient presents with   Follow-up    Referring provider: Vevelyn Francois, NP   HPI  46 year old female with history of hypertension and migraines  Patient presents today for follow-up on hypertension.  She was taken to the ED by EMS on 09/11/2021 but left AMA due to long wait time.  She states that the EMS told her that her blood pressure was 238/198 at that time.  Her blood pressure is elevated in office today but much better.  She still has not been on any blood pressure medications due to not having the money to pay for them.  [facility] and hopefully she will be able to pick them up this afternoon.  We will have patient return to the office tomorrow to have her blood pressure rechecked after starting the medications back this afternoon. Denies f/c/s, n/v/d, hemoptysis, PND, leg swelling Denies chest pain or edema     No Known Allergies   There is no immunization history on file for this patient.  Past Medical History:  Diagnosis Date   Hypertension    Migraine     Tobacco History: Social History   Tobacco Use  Smoking Status Former   Types: Cigarettes  Smokeless Tobacco Never   Counseling given: Not Answered   Outpatient Encounter Medications as of 09/29/2021  Medication Sig   amLODipine (NORVASC) 10 MG tablet Take 1 tablet (10 mg total) by mouth daily.   ondansetron (ZOFRAN) 4 MG tablet Take 1 tablet (4 mg total) by mouth every 8 (eight) hours as needed for nausea or vomiting.   triamterene-hydrochlorothiazide (MAXZIDE) 75-50 MG tablet Take 1 tablet by mouth daily.   [DISCONTINUED] amLODipine (NORVASC) 10 MG tablet Take 1 tablet (10 mg total) by mouth daily.   [DISCONTINUED] triamterene-hydrochlorothiazide (MAXZIDE) 75-50 MG tablet Take 1 tablet by mouth daily.   Facility-Administered Encounter  Medications as of 09/29/2021  Medication   cloNIDine (CATAPRES) tablet 0.1 mg   cloNIDine (CATAPRES) tablet 0.2 mg     Review of Systems  Review of Systems  Constitutional: Negative.   HENT: Negative.    Cardiovascular: Negative.   Gastrointestinal: Negative.   Allergic/Immunologic: Negative.   Neurological: Negative.   Psychiatric/Behavioral: Negative.         Physical Exam  BP (!) 156/110   Pulse 87   Resp 18   Wt Readings from Last 5 Encounters:  07/01/20 160 lb 0.9 oz (72.6 kg)  06/19/20 160 lb (72.6 kg)  05/26/20 161 lb 1.6 oz (73.1 kg)  04/30/20 160 lb 6.4 oz (72.8 kg)  04/28/20 156 lb (70.8 kg)     Physical Exam Vitals and nursing note reviewed.  Constitutional:      General: She is not in acute distress.    Appearance: She is well-developed.  Cardiovascular:     Rate and Rhythm: Normal rate and regular rhythm.  Pulmonary:     Effort: Pulmonary effort is normal.     Breath sounds: Normal breath sounds.  Neurological:     Mental Status: She is alert and oriented to person, place, and time.      Lab Results:  CBC    Component Value Date/Time   WBC 5.8 09/11/2021 0224   RBC 4.19 09/11/2021 0224   HGB 12.2 09/11/2021 0224   HGB 11.4 03/30/2020 0945  HCT 38.5 09/11/2021 0224   HCT 35.3 03/30/2020 0945   PLT 272 09/11/2021 0224   PLT 292 03/30/2020 0945   MCV 91.9 09/11/2021 0224   MCV 87 03/30/2020 0945   MCH 29.1 09/11/2021 0224   MCHC 31.7 09/11/2021 0224   RDW 13.8 09/11/2021 0224   RDW 13.6 03/30/2020 0945   LYMPHSABS 1.5 04/28/2020 1135   MONOABS 0.5 04/28/2020 1135   EOSABS 0.1 04/28/2020 1135   BASOSABS 0.0 04/28/2020 1135    BMET    Component Value Date/Time   NA 138 09/11/2021 0224   K 3.8 09/11/2021 0224   CL 107 09/11/2021 0224   CO2 23 09/11/2021 0224   GLUCOSE 91 09/11/2021 0224   BUN 12 09/11/2021 0224   CREATININE 0.96 09/11/2021 0224   CALCIUM 9.2 09/11/2021 0224   GFRNONAA >60 09/11/2021 0224   GFRAA >60  09/28/2016 0917     Assessment & Plan:   Essential hypertension - triamterene-hydrochlorothiazide (MAXZIDE) 75-50 MG tablet; Take 1 tablet by mouth daily.  Dispense: 90 tablet; Refill: 3  - amLODipine (NORVASC) 10 MG tablet; Take 1 tablet (10 mg total) by mouth daily.  Dispense: 90 tablet; Refill: 3  - AMB Referral to Pharmacy Medication Management   Follow up:  Please follow up tomorrow after getting medications for blood pressure recheck     Fenton Foy, NP 09/30/2021

## 2021-09-30 ENCOUNTER — Other Ambulatory Visit: Payer: Self-pay

## 2021-09-30 ENCOUNTER — Ambulatory Visit: Payer: No Typology Code available for payment source | Admitting: Nurse Practitioner

## 2021-09-30 ENCOUNTER — Other Ambulatory Visit: Payer: No Typology Code available for payment source | Admitting: Pharmacist

## 2021-09-30 ENCOUNTER — Encounter: Payer: Self-pay | Admitting: Nurse Practitioner

## 2021-09-30 ENCOUNTER — Other Ambulatory Visit: Payer: Self-pay | Admitting: Nurse Practitioner

## 2021-09-30 DIAGNOSIS — I10 Essential (primary) hypertension: Secondary | ICD-10-CM

## 2021-09-30 MED ORDER — VALSARTAN 80 MG PO TABS
80.0000 mg | ORAL_TABLET | Freq: Every day | ORAL | 11 refills | Status: DC
  Filled 2021-09-30: qty 30, 30d supply, fill #0

## 2021-09-30 NOTE — Patient Instructions (Signed)
Krista [name],   It was great talking to you today! As [name] instructed, start valsartan 80 mg daily.   I'll call you in 2 weeks to follow up on how you are feeling.   Take care!  [name], PharmD, [facility] [phone]

## 2021-09-30 NOTE — Progress Notes (Signed)
Chief Complaint  Patient presents with   Hypertension   Medication Management    Krista [name] is a 46 y.o. year old female who presented for a telephone visit.   They were referred to the pharmacist by their PCP for assistance in managing hypertension.   Subjective:  Care Team: Primary Care Provider: Lazaro Arms; Next Scheduled Visit: today  Medication Access/Adherence  Current Pharmacy:  [facility], [place] [address] Phone: [phone] Fax: [phone]  [id number] [place], [address] [place]Phone: [phone]: [phone] [facility] [address] [address]Phone: [phone]: [phone] [facility] [address] [address]Phone: [phone]: [address]Phone: [phone]: [phone]  Patient reports affordability concerns with their medications: Yes  Patient reports access/transportation concerns to their pharmacy: No  Patient reports adherence concerns with their medications:  Yes  had previously been out of antihypertensives due to being unable to afford the copay   Hypertension:  Current medications: amlodipine 10 mg daily, triamterene/HCTZ 75/50 mg daily - was able to pick up medications yesterday, took a dose last night and a dose this morning  Patient does not have a validated, automated, upper arm home BP cuff  She does report a headache this morning.    Health Maintenance  Health Maintenance Due  Topic Date Due    COVID-19 Vaccine (1) Never done   MAMMOGRAM  Never done   Hepatitis C Screening  Never done   TETANUS/TDAP  Never done   INFLUENZA VACCINE  Never done   COLONOSCOPY (Pts 45-443yrInsurance coverage will need to be confirmed)  Never done     Objective: No results found for: "HGBA1C"  Lab Results  Component Value Date   CREATININE 0.96 09/11/2021   BUN 12 09/11/2021   NA 138 09/11/2021   K 3.8 09/11/2021   CL 107 09/11/2021   CO2 23 09/11/2021    Lab Results  Component Value Date   CHOL 150 04/01/2020   HDL 39 (L) 04/01/2020   LDLCALC 102 (H) 04/01/2020   TRIG 41 04/01/2020   CHOLHDL 3.8 04/01/2020    Medications Reviewed Today     Reviewed by HaOsker MasonRPH-CPP (Pharmacist) on 09/30/21 at 09Wilson's Millsist Status: <None>   Medication Order Taking? Sig Documenting Provider Last Dose Status Informant  amLODipine (NORVASC) 10 MG tablet [id number] Take 1 tablet (10 mg total) by mouth daily. NiFenton FoyNP Taking Active   cloNIDine (CATAPRES) tablet 0.1 mg [id number] KiVevelyn FrancoisNP  Active   cloNIDine (CATAPRES) tablet 0.2 mg [id number] KiVevelyn FrancoisNP  Active   ondansetron (ZOFRAN) 4 MG tablet [id number] 1 tablet (4 mg total) by mouth every 8 (eight) hours as needed for nausea or vomiting. BrMelina SchoolsMD  Active   triamterene-hydrochlorothiazide ([facility]75-50 MG tablet [id number] Take 1 tablet by mouth daily. NiFenton FoyNP Taking Active  Assessment/Plan:   Hypertension: - Currently uncontrolled on max dose of amlodipine and HCTZ. Likely plan to reduce dose of HCTZ moving forward.  - Recommend to start valsartan 80 mg daily with follow up BP and BMP in ~2 weeks.    Follow Up Plan: phone call in 2 weeks  Catie TJodi Mourning, PharmD, [facility] [phone]

## 2021-09-30 NOTE — Assessment & Plan Note (Signed)
-   triamterene-hydrochlorothiazide (MAXZIDE) 75-50 MG tablet; Take 1 tablet by mouth daily.  Dispense: 90 tablet; Refill: 3  - amLODipine (NORVASC) 10 MG tablet; Take 1 tablet (10 mg total) by mouth daily.  Dispense: 90 tablet; Refill: 3  - AMB Referral to Pharmacy Medication Management   Follow up:  Please follow up tomorrow after getting medications for blood pressure recheck

## 2021-09-30 NOTE — Progress Notes (Signed)
   Blood Pressure Recheck Visit  Name: Krista [name] MRN: [id number] Date of Birth: 1975/07/21  [name] presents today for Blood Pressure recheck with clinical support staff.  Order for BP recheck by Lazaro Arms , ordered on 09/29/2021.   BP Readings from Last 3 Encounters:  09/29/21 (!) 156/110  09/11/21 (!) 177/125  07/01/20 (!) 148/98    Current Outpatient Medications  Medication Sig Dispense Refill   amLODipine (NORVASC) 10 MG tablet Take 1 tablet (10 mg total) by mouth daily. 90 tablet 3   ondansetron (ZOFRAN) 4 MG tablet Take 1 tablet (4 mg total) by mouth every 8 (eight) hours as needed for nausea or vomiting. 20 tablet 0   triamterene-hydrochlorothiazide (MAXZIDE) 75-50 MG tablet Take 1 tablet by mouth daily. 90 tablet 3   Current Facility-Administered Medications  Medication Dose Route Frequency Provider Last Rate Last Admin   cloNIDine (CATAPRES) tablet 0.1 mg  0.1 mg Oral Once Vevelyn Francois, NP       cloNIDine (CATAPRES) tablet 0.2 mg  0.2 mg Oral Once Vevelyn Francois, NP        Hypertensive Medication Review: Patient states that they are taking all their hypertensive medications as prescribed and their last dose of hypertensive medications was this morning   Documentation of any medication adherence discrepancies: none  Provider Recommendation:  Spoke to Lazaro Arms NP and they stated: Patient will need to return in 2 weeks for nurse / lab visit  Patient has been scheduled to follow up with in office visit   Patient has been given provider's recommendations and does not have any questions or concerns at this time. Patient will contact the office for any future questions or concerns.

## 2021-09-30 NOTE — Progress Notes (Signed)
Patient was seen in office today. Blood pressure remains elevated after starting back on maxzide and norvasc. Will add valsartan. Patient will need to return in 2 weeks for nurse / lab visit.

## 2021-10-03 ENCOUNTER — Other Ambulatory Visit: Payer: Self-pay

## 2021-10-04 ENCOUNTER — Ambulatory Visit: Payer: No Typology Code available for payment source | Admitting: Family Medicine

## 2021-10-04 ENCOUNTER — Other Ambulatory Visit: Payer: No Typology Code available for payment source

## 2021-10-04 DIAGNOSIS — I10 Essential (primary) hypertension: Secondary | ICD-10-CM

## 2021-10-04 NOTE — Progress Notes (Addendum)
   Blood Pressure Recheck Visit  Name: Krista [name] MRN: [id number] Date of Birth: [birth year]  Krista [name] presents today for Blood Pressure recheck with clinical support staff.  Order for BP recheck by Lazaro Arms NP, ordered on 09/29/2021.   BP Readings from Last 3 Encounters:  09/30/21 (!) 150/110  09/29/21 (!) 156/110  09/11/21 (!) 177/125    Current Outpatient Medications  Medication Sig Dispense Refill   amLODipine (NORVASC) 10 MG tablet Take 1 tablet (10 mg total) by mouth daily. 90 tablet 3   ondansetron (ZOFRAN) 4 MG tablet Take 1 tablet (4 mg total) by mouth every 8 (eight) hours as needed for nausea or vomiting. 20 tablet 0   triamterene-hydrochlorothiazide (MAXZIDE) 75-50 MG tablet Take 1 tablet by mouth daily. 90 tablet 3   valsartan (DIOVAN) 80 MG tablet Take 1 tablet (80 mg total) by mouth daily. 30 tablet 11   Current Facility-Administered Medications  Medication Dose Route Frequency Provider Last Rate Last Admin   cloNIDine (CATAPRES) tablet 0.1 mg  0.1 mg Oral Once Vevelyn Francois, NP       cloNIDine (CATAPRES) tablet 0.2 mg  0.2 mg Oral Once Vevelyn Francois, NP        Hypertensive Medication Review: Patient states that they are taking all their hypertensive medications as prescribed and their last dose of hypertensive medications was this morning   Documentation of any medication adherence discrepancies: none  Provider Recommendation:  Spoke to Krista Sickle NP and they stated: patient need to stay and have Clonidine patient refused said she needed to go back to work.  Patient has been scheduled to follow up with nurse visit on 10/07/2021 for BP recheck  Patient has been given provider's recommendations and does not have any questions or concerns at this time. Patient will contact the office for any future questions or concerns.

## 2021-10-05 LAB — BASIC METABOLIC PANEL
BUN/Creatinine Ratio: 9 (ref 9–23)
BUN: 9 mg/dL (ref 6–24)
CO2: 21 mmol/L (ref 20–29)
Calcium: 9.7 mg/dL (ref 8.7–10.2)
Chloride: 104 mmol/L (ref 96–106)
Creatinine, Ser: 0.95 mg/dL (ref 0.57–1.00)
Glucose: 85 mg/dL (ref 70–99)
Potassium: 4.3 mmol/L (ref 3.5–5.2)
Sodium: 140 mmol/L (ref 134–144)
eGFR: 75 mL/min/{1.73_m2} (ref >59)

## 2021-10-07 ENCOUNTER — Ambulatory Visit: Payer: Self-pay

## 2021-10-11 ENCOUNTER — Other Ambulatory Visit: Payer: No Typology Code available for payment source | Admitting: Pharmacist

## 2021-10-11 NOTE — Progress Notes (Signed)
10/11/2021 Name: Krista [name] MRN: [id number] DOB: 1975/12/23  Chief Complaint  Patient presents with   Medication Management   Hypertension    Krista [name] is a 46 y.o. year old female who presented for a telephone visit.   They were referred to the pharmacist by their PCP for assistance in managing hypertension.    Subjective:  Care Team: Primary Care Provider: Fenton Foy, NP ; Next Scheduled Visit: not scheduled  Medication Access/Adherence  Current Pharmacy:  [facility] [place], [place] [address] Phone: [phone] Fax: [place] [employer], [place] [address] Phone: [phone] Fax: [phone]   Patient reports affordability concerns with their medications: No  Patient reports access/transportation concerns to their pharmacy: No  Patient reports adherence concerns with their medications:  No     Hypertension:  Current medications: amlodipine 10 mg daily, triamterene/HCTZ 75/50 mg daily, valsartan 80 mg daily - picked up 10/03/21, so had only been on for 1 day before last nurse visit  Patient does not have a validated, automated, upper arm home BP cuff and cannot afford to purchase one at this time  Patient denies hypotensive s/sx including dizziness, lightheadedness.  Patient reports improvement in hypertensive symptoms, particularly headache.   Health Maintenance  Health Maintenance Due  Topic Date Due   COVID-19 Vaccine (1) Never done   MAMMOGRAM  Never done   Hepatitis C Screening  Never done   TETANUS/TDAP  Never done   INFLUENZA VACCINE  Never done   COLONOSCOPY (Pts 45-52yr Insurance coverage will need to be confirmed)  Never done     Objective: No results found for: "HGBA1C"  Lab Results  Component Value Date   CREATININE 0.95 10/04/2021   BUN 9 10/04/2021   NA 140 10/04/2021   K 4.3  10/04/2021   CL 104 10/04/2021   CO2 21 10/04/2021    Lab Results  Component Value Date   CHOL 150 04/01/2020   HDL 39 (L) 04/01/2020   LDLCALC 102 (H) 04/01/2020   TRIG 41 04/01/2020   CHOLHDL 3.8 04/01/2020    Medications Reviewed Today     Reviewed by HOsker Mason RPH-CPP (Pharmacist) on 10/11/21 at 1003  Med List Status: <None>   Medication Order Taking? Sig Documenting Provider Last Dose Status Informant  amLODipine (NORVASC) 10 MG tablet [id number] Take 1 tablet (10 mg total) by mouth daily. NFenton Foy NP Taking Active   cloNIDine (CATAPRES) tablet 0.1 mg [id number]  KVevelyn Francois NP  Active   cloNIDine (CATAPRES) tablet 0.2 mg [id number]  KVevelyn Francois NP  Active   ondansetron (ZOFRAN) 4 MG tablet [id number] Take 1 tablet (4 mg total) by mouth every 8 (eight) hours as needed for nausea or vomiting. BMelina Schools MD  Active   triamterene-hydrochlorothiazide ([facility] 75-50 MG tablet [id number] Take 1 tablet by mouth daily. NFenton Foy NP Taking Active   valsartan (DIOVAN) 80 MG tablet [id number] Take 1 tablet (80 mg total) by mouth daily. NFenton Foy NP Taking Active               Assessment/Plan:   Hypertension: - Currently uncontrolled - Assisted in rescheduling nurse visit for BP check. Will collaborate with office staff. Recommend BMP s/p initiation of ARB. If BP remains elevated, recommend increasing valsartan to 160 mg daily  Follow Up Plan: will review BP next week  [name], PharmD, [facility] [phone]

## 2021-10-11 NOTE — Patient Instructions (Addendum)
[name],   It was great talking to you today. Let's see how your blood pressure is doing next week in the office.   Please call with any questions or concerns. Thanks!  [name], PharmD, [facility] [phone]

## 2021-10-18 ENCOUNTER — Ambulatory Visit: Payer: No Typology Code available for payment source

## 2021-10-18 ENCOUNTER — Ambulatory Visit (INDEPENDENT_AMBULATORY_CARE_PROVIDER_SITE_OTHER): Payer: No Typology Code available for payment source | Admitting: Family Medicine

## 2021-10-18 ENCOUNTER — Other Ambulatory Visit: Payer: Self-pay | Admitting: Family Medicine

## 2021-10-18 ENCOUNTER — Encounter: Payer: Self-pay | Admitting: Family Medicine

## 2021-10-18 VITALS — BP 152/118 | HR 67

## 2021-10-18 DIAGNOSIS — I10 Essential (primary) hypertension: Secondary | ICD-10-CM

## 2021-10-18 MED ORDER — CLONIDINE HCL 0.1 MG PO TABS
0.2000 mg | ORAL_TABLET | Freq: Once | ORAL | Status: AC
  Administered 2021-10-18: 0.2 mg via ORAL

## 2021-10-18 NOTE — Progress Notes (Signed)
Patient presented for blood pressure check.  Blood pressure elevated.  She claims that she is taking all prescribed blood pressure medications.  Patient will receive clonidine 0.2 mg x 1.  We will recheck her blood pressure in 30 minutes.  Patient will need to follow-up with her PCP within 1 week for medication management.  BP (!) 152/118   Pulse 67   SpO2 100%    Donia Pounds  APRN, MSN, FNP-C [facility] [address] [place], [place] [address] [phone]   .

## 2021-10-18 NOTE — Progress Notes (Signed)
Meds ordered this encounter  Medications   cloNIDine (CATAPRES) tablet 0.2 mg    Krista [name] is a 46 year old female with a medical history significant for accelerated hypertension presented for a blood pressure check.  Upon arrival, patient's blood pressure is markedly elevated.  She states that she has been taking her medications consistently.  Today, patient will receive clonidine 0.2 mg x 1. Patient will follow-up with her primary care provider within 1 week Caban, MSN, FNP-C [facility] [address] [place], [place] [address] [phone]

## 2021-10-18 NOTE — Progress Notes (Unsigned)
Patient presents for BP check today. BP is 152/118. Pulse is 67. She takes amlodipine 10 mg QD, triamterene-HCTZ 75-50 QHS, and valsartan 80 QHS. Patient states that she takes her Bp and it was normal at home today.

## 2021-10-19 LAB — BASIC METABOLIC PANEL
BUN/Creatinine Ratio: 8 — ABNORMAL LOW (ref 9–23)
BUN: 7 mg/dL (ref 6–24)
CO2: 22 mmol/L (ref 20–29)
Calcium: 9.3 mg/dL (ref 8.7–10.2)
Chloride: 106 mmol/L (ref 96–106)
Creatinine, Ser: 0.87 mg/dL (ref 0.57–1.00)
Glucose: 82 mg/dL (ref 70–99)
Potassium: 4 mmol/L (ref 3.5–5.2)
Sodium: 139 mmol/L (ref 134–144)
eGFR: 84 mL/min/{1.73_m2} (ref >59)

## 2021-10-26 ENCOUNTER — Ambulatory Visit: Payer: Self-pay | Admitting: Nurse Practitioner

## 2021-12-17 ENCOUNTER — Emergency Department (HOSPITAL_BASED_OUTPATIENT_CLINIC_OR_DEPARTMENT_OTHER)
Admission: EM | Admit: 2021-12-17 | Discharge: 2021-12-17 | Discharge status: Against Medical Advice | Payer: No Typology Code available for payment source | Attending: Emergency Medicine | Admitting: Emergency Medicine

## 2021-12-17 DIAGNOSIS — Z5321 Procedure and treatment not carried out due to patient leaving prior to being seen by health care provider: Secondary | ICD-10-CM | POA: Insufficient documentation

## 2021-12-17 DIAGNOSIS — Y92410 Unspecified street and highway as the place of occurrence of the external cause: Secondary | ICD-10-CM | POA: Insufficient documentation

## 2021-12-17 DIAGNOSIS — M549 Dorsalgia, unspecified: Secondary | ICD-10-CM | POA: Diagnosis present

## 2021-12-17 LAB — URINALYSIS, ROUTINE W REFLEX MICROSCOPIC
Bilirubin Urine: NEGATIVE
Glucose, UA: NEGATIVE mg/dL
Ketones, ur: NEGATIVE mg/dL
Nitrite: POSITIVE — AB
Protein, ur: NEGATIVE mg/dL
Specific Gravity, Urine: 1.021 (ref 1.005–1.030)
pH: 6 (ref 5.0–8.0)

## 2021-12-17 LAB — PREGNANCY, URINE: Preg Test, Ur: NEGATIVE
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
## 2021-12-21 ENCOUNTER — Telehealth: Payer: Self-pay

## 2021-12-21 NOTE — Telephone Encounter (Signed)
Transition Care Management Follow-up Telephone Call Date of discharge and from where: 12/17/21 How have you been since you were released from the hospital? Per pt she is still in pain Any questions or concerns? No  Items Reviewed: Did the pt receive and understand the discharge instructions provided? No pt had left Medications obtained and verified? No  Other? No  Any new allergies since your discharge? No  Dietary orders reviewed? No Do you have support at home? Yes    Follow up appointments reviewed:  PCP Hospital f/u appt confirmed? Yes  Scheduled to see [name]  on 1/4/204 @ 11:20 am. [facility] f/u appt confirmed? No   Are transportation arrangements needed? No  If their condition worsens, is the pt aware to call PCP or go to the Emergency Dept.? Yes Was the patient provided with contact information for the PCP's office or ED? Yes Was to pt encouraged to call back with questions or concerns? Yes   Elyse Jarvis RMA

## 2022-01-05 ENCOUNTER — Inpatient Hospital Stay: Payer: Self-pay | Admitting: Nurse Practitioner

## 2022-01-18 ENCOUNTER — Telehealth: Payer: Self-pay | Admitting: Pharmacist

## 2022-01-18 NOTE — Telephone Encounter (Signed)
Patient appearing on report for [employer] - Hypertension Control report due to last documented ambulatory blood pressure of 152/118 on 10/18/21. Next appointment with PCP is tomorrow   Outreached patient to discuss hypertension control and medication management. Left voicemail for patient to return my call at their convenience.   [name], PharmD, [place], [facility] [phone]

## 2022-01-19 ENCOUNTER — Other Ambulatory Visit: Payer: Self-pay

## 2022-01-19 ENCOUNTER — Ambulatory Visit (INDEPENDENT_AMBULATORY_CARE_PROVIDER_SITE_OTHER): Payer: No Typology Code available for payment source | Admitting: Nurse Practitioner

## 2022-01-19 ENCOUNTER — Encounter: Payer: Self-pay | Admitting: Nurse Practitioner

## 2022-01-19 VITALS — BP 146/108 | HR 81 | Wt 149.6 lb

## 2022-01-19 DIAGNOSIS — M25511 Pain in right shoulder: Secondary | ICD-10-CM

## 2022-01-19 DIAGNOSIS — I10 Essential (primary) hypertension: Secondary | ICD-10-CM

## 2022-01-19 DIAGNOSIS — Z1159 Encounter for screening for other viral diseases: Secondary | ICD-10-CM | POA: Diagnosis not present

## 2022-01-19 DIAGNOSIS — Z1211 Encounter for screening for malignant neoplasm of colon: Secondary | ICD-10-CM | POA: Diagnosis not present

## 2022-01-19 DIAGNOSIS — Z1231 Encounter for screening mammogram for malignant neoplasm of breast: Secondary | ICD-10-CM

## 2022-01-19 DIAGNOSIS — I499 Cardiac arrhythmia, unspecified: Secondary | ICD-10-CM

## 2022-01-19 DIAGNOSIS — Z23 Encounter for immunization: Secondary | ICD-10-CM | POA: Diagnosis not present

## 2022-01-19 MED ORDER — KETOROLAC TROMETHAMINE 30 MG/ML IJ SOLN
30.0000 mg | Freq: Once | INTRAMUSCULAR | Status: AC
  Administered 2022-01-19: 30 mg via INTRAMUSCULAR

## 2022-01-19 MED ORDER — TIZANIDINE HCL 4 MG PO TABS
4.0000 mg | ORAL_TABLET | Freq: Four times a day (QID) | ORAL | 0 refills | Status: DC | PRN
  Filled 2022-01-19: qty 30, 8d supply, fill #0

## 2022-01-19 MED ORDER — PREDNISONE 20 MG PO TABS
20.0000 mg | ORAL_TABLET | Freq: Every day | ORAL | 0 refills | Status: AC
  Filled 2022-01-19: qty 5, 5d supply, fill #0

## 2022-01-19 MED ORDER — KETOROLAC TROMETHAMINE 30 MG/ML IJ SOLN
30.0000 mg | Freq: Once | INTRAMUSCULAR | 0 refills | Status: AC
  Filled 2022-01-19 (×2): qty 1, 1d supply, fill #0

## 2022-01-19 NOTE — Progress Notes (Unsigned)
$'@Patient'Y$  ID: Krista [name], female    DOB: 06/30/1975, 47 y.o.   MRN: [id number]  Chief Complaint  Patient presents with   Hypertension    some chest  pain on and off with sob. No dizziness    Referring provider: Fenton Foy, NP   HPI  Patient presents today for follow-up on hypertension.  Patient states that she does have intermittent chest pain.  In office today.  We will check EKG. we will check blood work.  Patient states that she is compliant with medications but blood pressure is elevated.  We will consult pharmacy for medication management.  Patient may end up needing a referral to hypertension specialist. Denies f/c/s, n/v/d, hemoptysis, PND, leg swelling Denies chest pain or edema       Allergies  Allergen Reactions   Tea Swelling    There is no immunization history for the selected administration types on file for this patient.  Past Medical History:  Diagnosis Date   Hypertension    Migraine     Tobacco History: Social History   Tobacco Use  Smoking Status Former   Types: Cigarettes  Smokeless Tobacco Never   Counseling given: Not Answered   Outpatient Encounter Medications as of 01/19/2022  Medication Sig   amLODipine (NORVASC) 10 MG tablet Take 1 tablet (10 mg total) by mouth daily.   [EXPIRED] ketorolac (TORADOL) 30 MG/ML injection Inject 1 mL (30 mg total) once for 1 dose.   ondansetron (ZOFRAN) 4 MG tablet Take 1 tablet (4 mg total) by mouth every 8 (eight) hours as needed for nausea or vomiting.   predniSONE (DELTASONE) 20 MG tablet Take 1 tablet (20 mg total) by mouth daily with breakfast for 5 days.   tiZANidine (ZANAFLEX) 4 MG tablet Take 1 tablet (4 mg total) by mouth every 6 (six) hours as needed for muscle spasms.   [DISCONTINUED] triamterene-hydrochlorothiazide (MAXZIDE) 75-50 MG tablet Take 1 tablet by mouth daily.   [DISCONTINUED] valsartan (DIOVAN) 80 MG tablet Take 1 tablet (80 mg total) by mouth daily.   Facility-Administered  Encounter Medications as of 01/19/2022  Medication   cloNIDine (CATAPRES) tablet 0.1 mg   [COMPLETED] ketorolac (TORADOL) 30 MG/ML injection 30 mg     Review of Systems  Review of Systems  Constitutional: Negative.   HENT: Negative.    Cardiovascular: Negative.   Gastrointestinal: Negative.   Allergic/Immunologic: Negative.   Neurological: Negative.   Psychiatric/Behavioral: Negative.         Physical Exam  BP (!) 146/108   Pulse 81   Wt 149 lb 9.6 oz (67.9 kg)   LMP 11/02/2021   SpO2 100%   BMI 22.09 kg/m   Wt Readings from Last 5 Encounters:  01/19/22 149 lb 9.6 oz (67.9 kg)  12/17/21 142 lb (64.4 kg)  07/01/20 160 lb 0.9 oz (72.6 kg)  06/19/20 160 lb (72.6 kg)  05/26/20 161 lb 1.6 oz (73.1 kg)     Physical Exam Vitals and nursing note reviewed.  Constitutional:      General: She is not in acute distress.    Appearance: She is well-developed.  Cardiovascular:     Rate and Rhythm: Normal rate and regular rhythm.  Pulmonary:     Effort: Pulmonary effort is normal.     Breath sounds: Normal breath sounds.  Neurological:     Mental Status: She is alert and oriented to person, place, and time.      Lab Results:  CBC    Component  Value Date/Time   WBC 5.2 01/19/2022 1557   WBC 5.8 09/11/2021 0224   RBC 4.34 01/19/2022 1557   RBC 4.19 09/11/2021 0224   HGB 13.6 01/19/2022 1557   HCT 39.8 01/19/2022 1557   PLT 260 01/19/2022 1557   MCV 92 01/19/2022 1557   MCH 31.3 01/19/2022 1557   MCH 29.1 09/11/2021 0224   MCHC 34.2 01/19/2022 1557   MCHC 31.7 09/11/2021 0224   RDW 11.6 (L) 01/19/2022 1557   LYMPHSABS 1.5 04/28/2020 1135   MONOABS 0.5 04/28/2020 1135   EOSABS 0.1 04/28/2020 1135   BASOSABS 0.0 04/28/2020 1135    BMET    Component Value Date/Time   NA 140 01/19/2022 1557   K 4.1 01/19/2022 1557   CL 102 01/19/2022 1557   CO2 22 01/19/2022 1557   GLUCOSE 84 01/19/2022 1557   GLUCOSE 91 09/11/2021 0224   BUN 10 01/19/2022 1557    CREATININE 0.99 01/19/2022 1557   CALCIUM 9.6 01/19/2022 1557   GFRNONAA >60 09/11/2021 0224   GFRAA >60 09/28/2016 0917     Assessment & Plan:   Colon cancer screening - Cologuard  2. Need for Tdap vaccination   3. Need for hepatitis C screening test  - Hepatitis C antibody  4. Acute pain of right shoulder  - predniSONE (DELTASONE) 20 MG tablet; Take 1 tablet (20 mg total) by mouth daily with breakfast for 5 days.  Dispense: 5 tablet; Refill: 0 - tiZANidine (ZANAFLEX) 4 MG tablet; Take 1 tablet (4 mg total) by mouth every 6 (six) hours as needed for muscle spasms.  Dispense: 30 tablet; Refill: 0 - ketorolac (TORADOL) 30 MG/ML injection; Inject 1 mL (30 mg total) once for 1 dose.  Dispense: 1 mL; Refill: 0 - ketorolac (TORADOL) 30 MG/ML injection 30 mg  5. Irregular heart beat  - EKG 12-Lead  6. Essential hypertension  - CBC - Comprehensive metabolic panel - AMB Referral to Pharmacy Medication Management  Follow up:  Follow up in 6 months     Fenton Foy, NP 01/26/2022

## 2022-01-20 LAB — COMPREHENSIVE METABOLIC PANEL
ALT: 14 IU/L (ref 0–32)
AST: 23 IU/L (ref 0–40)
Albumin/Globulin Ratio: 1.5 (ref 1.2–2.2)
Albumin: 4.4 g/dL (ref 3.9–4.9)
Alkaline Phosphatase: 57 IU/L (ref 44–121)
BUN/Creatinine Ratio: 10 (ref 9–23)
BUN: 10 mg/dL (ref 6–24)
Bilirubin Total: 0.4 mg/dL (ref 0.0–1.2)
CO2: 22 mmol/L (ref 20–29)
Calcium: 9.6 mg/dL (ref 8.7–10.2)
Chloride: 102 mmol/L (ref 96–106)
Creatinine, Ser: 0.99 mg/dL (ref 0.57–1.00)
Globulin, Total: 2.9 g/dL (ref 1.5–4.5)
Glucose: 84 mg/dL (ref 70–99)
Potassium: 4.1 mmol/L (ref 3.5–5.2)
Sodium: 140 mmol/L (ref 134–144)
Total Protein: 7.3 g/dL (ref 6.0–8.5)
eGFR: 72 mL/min/{1.73_m2} (ref >59)

## 2022-01-20 LAB — CBC
Hematocrit: 39.8 % (ref 34.0–46.6)
Hemoglobin: 13.6 g/dL (ref 11.1–15.9)
MCH: 31.3 pg (ref 26.6–33.0)
MCHC: 34.2 g/dL (ref 31.5–35.7)
MCV: 92 fL (ref 79–97)
Platelets: 260 10*3/uL (ref 150–450)
RBC: 4.34 x10E6/uL (ref 3.77–5.28)
RDW: 11.6 % — ABNORMAL LOW (ref 11.7–15.4)
WBC: 5.2 10*3/uL (ref 3.4–10.8)

## 2022-01-20 LAB — HEPATITIS C ANTIBODY: Hep C Virus Ab: NONREACTIVE

## 2022-01-23 ENCOUNTER — Other Ambulatory Visit: Payer: Self-pay

## 2022-01-24 ENCOUNTER — Other Ambulatory Visit: Payer: No Typology Code available for payment source | Admitting: Pharmacist

## 2022-01-24 DIAGNOSIS — I10 Essential (primary) hypertension: Secondary | ICD-10-CM

## 2022-01-24 NOTE — Progress Notes (Unsigned)
01/24/2022 Name: Krista [name] MRN: [id number] DOB: [birth year]  Chief Complaint  Patient presents with   Medication Management   Hypertension    Krista [name] is a 47 y.o. year old female who presented for a telephone visit.   They were referred to the pharmacist by their PCP for assistance in managing hypertension.   Patient is participating in a Managed Medicaid Plan:  Yes  Subjective:  Care Team: Primary Care Provider: Fenton Foy, NP ; Next Scheduled Visit: not scheduled  Medication Access/Adherence  Current Pharmacy:  [facility] [place] [address] [place] [address] Phone: [phone] Fax: [phone]  [facility] [employer], [place] [address] Phone: [phone] Fax: [phone]   Patient reports affordability concerns with their medications: No  Patient reports access/transportation concerns to their pharmacy: No  Patient reports adherence concerns with their medications:  No     Hypertension:  Current medications: triamterene/HCTZ 75/50 mg daily, amlodipine 10 mg daily, valsartan 80 mg daily  Reports she takes amlodipine in the afternoon and triamterene/HCTZ and amlodipine in the evenings. Does indicate nocturia. Reports that due to her work schedule, it would be hard to take the diuretic in the morning. Denies a history of lower extremity edema/swelling that prompted use of diuretic  Patient has an automated, upper arm home BP cuff - belongs to her mother, she checks periodically  Current blood pressure readings readings: reports a recent reading of 122/99  Patient denies hypotensive s/sx including dizziness, lightheadedness.  Patient denies hypertensive symptoms including headache, chest pain, shortness of breath   Objective:   Lab Results  Component Value Date   CREATININE 0.99 01/19/2022   BUN 10 01/19/2022   NA 140 01/19/2022    K 4.1 01/19/2022   CL 102 01/19/2022   CO2 22 01/19/2022    Lab Results  Component Value Date   CHOL 150 04/01/2020   HDL 39 (L) 04/01/2020   LDLCALC 102 (H) 04/01/2020   TRIG 41 04/01/2020   CHOLHDL 3.8 04/01/2020    Medications Reviewed Today     Reviewed by Osker Mason, RPH-CPP (Pharmacist) on 01/24/22 at [phone]  Med List Status: <None>   Medication Order Taking? Sig Documenting Provider Last Dose Status Informant  amLODipine (NORVASC) 10 MG tablet [id number] Yes Take 1 tablet (10 mg total) by mouth daily. Fenton Foy, NP Taking Active   cloNIDine (CATAPRES) tablet 0.1 mg [id number]   Vevelyn Francois, NP  Consider Medication Status and Discontinue (Completed Course)   ondansetron (ZOFRAN) 4 MG tablet [id number]  Take 1 tablet (4 mg total) by mouth every 8 (eight) hours as needed for nausea or vomiting. Melina Schools, MD  Active            Med Note Elyse Jarvis   Thu Jan 19, 2022  3:16 PM) Prn   predniSONE (DELTASONE) 20 MG tablet [id number] Yes Take 1 tablet (20 mg total) by mouth daily with breakfast for 5 days. Fenton Foy, NP Taking Active   tiZANidine (ZANAFLEX) 4 MG tablet [id number] Yes Take 1 tablet (4 mg total) by mouth every 6 (six) hours as needed for muscle spasms. Fenton Foy, NP Taking Active   triamterene-hydrochlorothiazide (MAXZIDE) 75-50 MG tablet [id number] Yes Take 1 tablet by mouth daily. Fenton Foy, NP Taking Active   valsartan (DIOVAN) 80 MG tablet [id number] Yes Take 1 tablet (80 mg total) by mouth  daily. Fenton Foy, NP Taking Active               Assessment/Plan:   Hypertension: - Currently uncontrolled - Reviewed long term cardiovascular and renal outcomes of uncontrolled blood pressure - Reviewed appropriate blood pressure monitoring technique and reviewed goal blood pressure. Recommended to check home blood pressure and heart rate 2-3 times weekly - Recommend to decrease triamterene/HCTZ to 1/2 tablet daily,  increase valsartan to 160 mg daily and continue amlodipine 10 mg daily. Will follow impact of adjustments; reducing and possibly eliminating diuretic and allowing patient's regimen to be taken altogether at night expected to help compliance.     Follow Up Plan: phone call in 3 weeks  Catie TJodi Mourning, PharmD, [place], [facility] [phone]

## 2022-01-24 NOTE — Patient Instructions (Addendum)
Cassie,   Increase valsartan to 160 mg. You can take 2 tablets of 80 mg daily until you finish the supply you have. Continue amlodipine 10 mg daily. You can take both of these together in the evening.   Decrease the triamterene/hydrochlorothiazide to 1/2 tablet daily. Take this one as early as possible in the day, as it works by causing you to pee more frequently.   Check your blood pressure twice weekly, and any time you have concerning symptoms like headache, chest pain, dizziness, shortness of breath, or vision changes.   Our goal is less than 130/80.  To appropriately check your blood pressure, make sure you do the following:  1) Avoid caffeine, exercise, or tobacco products for 30 minutes before checking. Empty your bladder. 2) Sit with your back supported in a flat-backed chair. Rest your arm on something flat (arm of the chair, table, etc). 3) Sit still with your feet flat on the floor, resting, for at least 5 minutes.  4) Check your blood pressure. Take 1-2 readings.  5) Write down these readings and bring with you to any provider appointments.  Bring your home blood pressure machine with you to a provider's office for accuracy comparison at least once a year.   Make sure you take your blood pressure medications before you come to any office visit, even if you were asked to fast for labs.  Take care!  [name], PharmD, [place], [facility] [phone]

## 2022-01-25 ENCOUNTER — Other Ambulatory Visit: Payer: Self-pay

## 2022-01-25 MED ORDER — VALSARTAN 160 MG PO TABS
160.0000 mg | ORAL_TABLET | Freq: Every day | ORAL | 3 refills | Status: DC
Start: 2022-01-25 — End: 2022-02-15
  Filled 2022-01-25: qty 90, 90d supply, fill #0

## 2022-01-25 MED ORDER — TRIAMTERENE-HCTZ 75-50 MG PO TABS: 0.5000 | ORAL_TABLET | Freq: Every day | ORAL | 3 refills | Status: DC

## 2022-01-26 ENCOUNTER — Other Ambulatory Visit: Payer: Self-pay

## 2022-01-26 ENCOUNTER — Encounter: Payer: Self-pay | Admitting: Nurse Practitioner

## 2022-01-26 DIAGNOSIS — Z1211 Encounter for screening for malignant neoplasm of colon: Secondary | ICD-10-CM | POA: Insufficient documentation

## 2022-01-26 NOTE — Patient Instructions (Addendum)
1. Colon cancer screening  - Cologuard  2. Need for Tdap vaccination   3. Need for hepatitis C screening test  - Hepatitis C antibody  4. Acute pain of right shoulder  - predniSONE (DELTASONE) 20 MG tablet; Take 1 tablet (20 mg total) by mouth daily with breakfast for 5 days.  Dispense: 5 tablet; Refill: 0 - tiZANidine (ZANAFLEX) 4 MG tablet; Take 1 tablet (4 mg total) by mouth every 6 (six) hours as needed for muscle spasms.  Dispense: 30 tablet; Refill: 0 - ketorolac (TORADOL) 30 MG/ML injection; Inject 1 mL (30 mg total) once for 1 dose.  Dispense: 1 mL; Refill: 0 - ketorolac (TORADOL) 30 MG/ML injection 30 mg  5. Irregular heart beat  - EKG 12-Lead  6. Essential hypertension  - CBC - Comprehensive metabolic panel - AMB Referral to Pharmacy Medication Management  Follow up:  Follow up in 6 months

## 2022-01-26 NOTE — Assessment & Plan Note (Signed)
-  Cologuard  2. Need for Tdap vaccination   3. Need for hepatitis C screening test  - Hepatitis C antibody  4. Acute pain of right shoulder  - predniSONE (DELTASONE) 20 MG tablet; Take 1 tablet (20 mg total) by mouth daily with breakfast for 5 days.  Dispense: 5 tablet; Refill: 0 - tiZANidine (ZANAFLEX) 4 MG tablet; Take 1 tablet (4 mg total) by mouth every 6 (six) hours as needed for muscle spasms.  Dispense: 30 tablet; Refill: 0 - ketorolac (TORADOL) 30 MG/ML injection; Inject 1 mL (30 mg total) once for 1 dose.  Dispense: 1 mL; Refill: 0 - ketorolac (TORADOL) 30 MG/ML injection 30 mg  5. Irregular heart beat  - EKG 12-Lead  6. Essential hypertension  - CBC - Comprehensive metabolic panel - AMB Referral to Pharmacy Medication Management  Follow up:  Follow up in 6 months

## 2022-02-02 LAB — COLOGUARD: COLOGUARD: NEGATIVE

## 2022-02-14 ENCOUNTER — Other Ambulatory Visit: Payer: No Typology Code available for payment source | Admitting: Pharmacist

## 2022-02-14 NOTE — Progress Notes (Signed)
02/14/2022 Name: Krista [name] MRN: [id number] DOB: 1975-09-28  Chief Complaint  Patient presents with   Medication Management   Hypertension    Krista [name] is a 47 y.o. year old female who presented for a telephone visit.   They were referred to the pharmacist by their PCP for assistance in managing hypertension.   Subjective:  Care Team: Primary Care Provider: Fenton Foy, NP ; Next Scheduled Visit: 06/2022  Medication Access/Adherence  Current Pharmacy:  [place], [place] - [address] [place] [address] Phone: [phone] Fax: [facility], [place] [address] Phone: [phone] Fax: [phone]   Patient reports affordability concerns with their medications: No  Patient reports access/transportation concerns to their pharmacy: No  Patient reports adherence concerns with their medications:  No     Hypertension:  Current medications: triamterene/HCTZ 75/50 mg 1/2 tablet daily, amlodipine 10 mg daily, valsartan 160 mg daily  Reports improvement in nocturia/polyuria with reduced dose of triamterene/HCTZ. Denies any increased swelling/edema. Denies any other side effects since making recent changes.   Does have a question about her urine being smelly. Denies any burning/itching/stinging while urinating. Denies any fevers or pain.   Patient has a validated, automated, upper arm home BP cuff Current blood pressure readings readings: generally 110-130s/90-100s  Patient denies hypotensive s/sx including dizziness, lightheadedness.  Patient denies hypertensive symptoms including headache, chest pain, shortness of breath  Objective:  No results found for: "HGBA1C"  Lab Results  Component Value Date   CREATININE 0.99 01/19/2022   BUN 10 01/19/2022   NA 140 01/19/2022   K 4.1 01/19/2022   CL 102 01/19/2022   CO2 22  01/19/2022    Lab Results  Component Value Date   CHOL 150 04/01/2020   HDL 39 (L) 04/01/2020   LDLCALC 102 (H) 04/01/2020   TRIG 41 04/01/2020   CHOLHDL 3.8 04/01/2020    Medications Reviewed Today     Reviewed by Osker Mason, RPH-CPP (Pharmacist) on 02/14/22 at [phone]  Med List Status: <None>   Medication Order Taking? Sig Documenting Provider Last Dose Status Informant  amLODipine (NORVASC) 10 MG tablet [id number] Yes Take 1 tablet (10 mg total) by mouth daily. Fenton Foy, NP Taking Active   cloNIDine (CATAPRES) tablet 0.1 mg [id number]   Vevelyn Francois, NP  Active   ondansetron (ZOFRAN) 4 MG tablet [id number]  Take 1 tablet (4 mg total) by mouth every 8 (eight) hours as needed for nausea or vomiting. Melina Schools, MD  Active            Med Note Mallie Mussel, [facility]   Thu Jan 19, 2022  3:16 PM) Prn   tiZANidine (ZANAFLEX) 4 MG tablet [id number]  Take 1 tablet (4 mg total) by mouth every 6 (six) hours as needed for muscle spasms. Fenton Foy, NP  Active   triamterene-hydrochlorothiazide (MAXZIDE) 75-50 MG tablet [id number] Yes Take 0.5 tablets by mouth daily. Fenton Foy, NP Taking Active   valsartan (DIOVAN) 160 MG tablet [id number] Yes Take 1 tablet (160 mg total) by mouth daily. Fenton Foy, NP Taking Active               Assessment/Plan:   Hypertension: - Currently uncontrolled due to elevated diastolic readings.  - Recommend to increase valsartan to 320 mg daily, continue amlodipine 10 mg daily and triamterene/HCTZ 37.5/25 (1/2 of 75/50 mg tablet)  mg daily. Patient denies need for refills at this time. Will discuss with PCP.  - Reviewed long term cardiovascular and renal outcomes of uncontrolled blood pressure - Reviewed appropriate blood pressure monitoring technique and reviewed goal blood pressure. Recommended to check home blood pressure and heart rate daily - Recommend to contact myself or PCP if development of low systolic readings [id number] or  hypotensive symptoms, including lightheadedness, dizziness, presyncopal symptoms   Encouraged to contact her OBGYN for evaluation for urine.     Follow Up Plan: phone call in 4 weeks  Catie TJodi Mourning, PharmD, [place], [facility] [phone]

## 2022-02-15 ENCOUNTER — Other Ambulatory Visit: Payer: Self-pay

## 2022-02-15 MED ORDER — VALSARTAN 320 MG PO TABS
320.0000 mg | ORAL_TABLET | Freq: Every day | ORAL | 1 refills | Status: DC
  Filled 2022-02-15: qty 90, 90d supply, fill #0

## 2022-02-21 ENCOUNTER — Other Ambulatory Visit: Payer: Self-pay

## 2022-03-10 ENCOUNTER — Ambulatory Visit
Admission: RE | Admit: 2022-03-10 | Discharge: 2022-03-10 | Disposition: A | Discharge status: Home / Self Care | Payer: No Typology Code available for payment source | Source: Ambulatory Visit | Attending: Nurse Practitioner | Admitting: Nurse Practitioner

## 2022-03-10 DIAGNOSIS — Z1231 Encounter for screening mammogram for malignant neoplasm of breast: Secondary | ICD-10-CM

## 2022-03-28 ENCOUNTER — Other Ambulatory Visit: Payer: Self-pay

## 2022-03-28 ENCOUNTER — Other Ambulatory Visit: Payer: No Typology Code available for payment source | Admitting: Pharmacist

## 2022-03-28 MED ORDER — TRIAMTERENE-HCTZ 37.5-25 MG PO TABS
1.0000 | ORAL_TABLET | Freq: Every day | ORAL | 3 refills | Status: DC
  Filled 2022-03-28: qty 90, 90d supply, fill #0

## 2022-03-28 NOTE — Patient Instructions (Addendum)
Jezebelle,   Keep up the fantastic work! Continue valsartan 320 mg (two of the 160 mg until you finish that supply, then pick up the prescription for the 320 mg tablet) and triamterene/HCTZ 37.5/25 mg (keep taking 1/2 of the tablet you have until you finish the supply, then pick up the prescription for the 37.5/25 mg tablet).   Keep an eye on your blood pressure. If you start to see your blood pressure readings trend back up to >140/90, please give [place] a call.   Check your blood pressure twice weekly, and any time you have concerning symptoms like headache, chest pain, dizziness, shortness of breath, or vision changes.   Our goal is less than 130/80.  To appropriately check your blood pressure, make sure you do the following:  1) Avoid caffeine, exercise, or tobacco products for 30 minutes before checking. Empty your bladder. 2) Sit with your back supported in a flat-backed chair. Rest your arm on something flat (arm of the chair, table, etc). 3) Sit still with your feet flat on the floor, resting, for at least 5 minutes.  4) Check your blood pressure. Take 1-2 readings.  5) Write down these readings and bring with you to any provider appointments.  Bring your home blood pressure machine with you to a provider's office for accuracy comparison at least once a year.   Make sure you take your blood pressure medications before you come to any office visit, even if you were asked to fast for labs.   Take care!  [name], PharmD, [place], [facility] [phone]

## 2022-03-28 NOTE — Progress Notes (Signed)
03/28/2022 Name: Krista [name] MRN: [id number] DOB: 1975/12/07  Chief Complaint  Patient presents with   Medication Management   Hypertension    Krista [name] is a 47 y.o. year old female who presented for a telephone visit.   They were referred to the pharmacist by their PCP for assistance in managing hypertension.   Patient is participating in a Managed Medicaid Plan:  Yes  Subjective:  Care Team: Primary Care Provider: Fenton Foy, NP ; Next Scheduled Visit: 06/2022  Medication Access/Adherence  Current Pharmacy:  [place], [place] - [address] [place] [address] Phone: [phone] Fax: [facility], [place] [address] Phone: [phone] Fax: [phone]   Patient reports affordability concerns with their medications: No  Patient reports access/transportation concerns to their pharmacy: No  Patient reports adherence concerns with their medications:  No     Hypertension:  Current medications: valsartan 320 mg daily, triamterene/HCTZ 37.5/25 mg daily; misunderstanding previously and patient has not been taking amlodipine for several months  Continues to endorse improvement in nocturia with reducing dose of triamterene/HCTZ  Patient has a validated, automated, upper arm home BP cuff Current blood pressure readings readings: 120/80s at home  Patient denies hypotensive s/sx including dizziness, lightheadedness.  Patient denies hypertensive symptoms including headache, chest pain, shortness of breath   Objective:  Lab Results  Component Value Date   CREATININE 0.99 01/19/2022   BUN 10 01/19/2022   NA 140 01/19/2022   K 4.1 01/19/2022   CL 102 01/19/2022   CO2 22 01/19/2022    Lab Results  Component Value Date   CHOL 150 04/01/2020   HDL 39 (L) 04/01/2020   LDLCALC 102 (H) 04/01/2020   TRIG 41 04/01/2020    CHOLHDL 3.8 04/01/2020    Medications Reviewed Today     Reviewed by Osker Mason, RPH-CPP (Pharmacist) on 02/14/22 at [phone]  Med List Status: <None>   Medication Order Taking? Sig Documenting Provider Last Dose Status Informant  amLODipine (NORVASC) 10 MG tablet [id number] Yes Take 1 tablet (10 mg total) by mouth daily. Fenton Foy, NP Taking Active   cloNIDine (CATAPRES) tablet 0.1 mg [id number]   Vevelyn Francois, NP  Active   ondansetron (ZOFRAN) 4 MG tablet [id number]  Take 1 tablet (4 mg total) by mouth every 8 (eight) hours as needed for nausea or vomiting. Melina Schools, MD  Active            Med Note Mallie Mussel, [facility]   Thu Jan 19, 2022  3:16 PM) Prn   tiZANidine (ZANAFLEX) 4 MG tablet [id number]  Take 1 tablet (4 mg total) by mouth every 6 (six) hours as needed for muscle spasms. Fenton Foy, NP  Active   triamterene-hydrochlorothiazide (MAXZIDE) 75-50 MG tablet [id number] Yes Take 0.5 tablets by mouth daily. Fenton Foy, NP Taking Active   valsartan (DIOVAN) 160 MG tablet [id number] Yes Take 1 tablet (160 mg total) by mouth daily. Fenton Foy, NP Taking Active               Assessment/Plan:   Hypertension: - Currently controlled - Reviewed long term cardiovascular and renal outcomes of uncontrolled blood pressure - Reviewed appropriate blood pressure monitoring technique and reviewed goal blood pressure. Recommended to check home blood pressure and heart rate periodically - Recommend to continue current regimen. Refill orders placed for co-sign per patient request  Follow Up Plan: follow up with PCP in 3 months as scheduled  [name], PharmD, [place], [place] [phone]

## 2022-04-03 ENCOUNTER — Other Ambulatory Visit: Payer: Self-pay

## 2022-06-28 ENCOUNTER — Ambulatory Visit: Payer: Self-pay | Admitting: Nurse Practitioner

## 2022-07-05 ENCOUNTER — Ambulatory Visit (INDEPENDENT_AMBULATORY_CARE_PROVIDER_SITE_OTHER): Payer: Medicaid Other | Admitting: Nurse Practitioner

## 2022-07-05 ENCOUNTER — Encounter: Payer: Self-pay | Admitting: Nurse Practitioner

## 2022-07-05 ENCOUNTER — Other Ambulatory Visit: Payer: Self-pay

## 2022-07-05 VITALS — BP 153/114 | HR 70 | Temp 97.2°F | Wt 161.6 lb

## 2022-07-05 DIAGNOSIS — Z13220 Encounter for screening for lipoid disorders: Secondary | ICD-10-CM | POA: Diagnosis not present

## 2022-07-05 DIAGNOSIS — R399 Unspecified symptoms and signs involving the genitourinary system: Secondary | ICD-10-CM | POA: Diagnosis not present

## 2022-07-05 DIAGNOSIS — I10 Essential (primary) hypertension: Secondary | ICD-10-CM | POA: Diagnosis not present

## 2022-07-05 LAB — POCT URINALYSIS DIP (PROADVANTAGE DEVICE)
Bilirubin, UA: NEGATIVE
Glucose, UA: NEGATIVE mg/dL
Ketones, POC UA: NEGATIVE mg/dL
Leukocytes, UA: NEGATIVE
Nitrite, UA: NEGATIVE
Protein Ur, POC: NEGATIVE mg/dL
Specific Gravity, Urine: 1.02
Urobilinogen, Ur: 0.2
pH, UA: 6.5 (ref 5.0–8.0)

## 2022-07-05 MED ORDER — AMLODIPINE BESYLATE 5 MG PO TABS
5.0000 mg | ORAL_TABLET | Freq: Every day | ORAL | 2 refills | Status: AC
Start: 2022-07-05 — End: ?
  Filled 2022-07-05 – 2023-01-22 (×2): qty 90, 90d supply, fill #0
  Filled 2023-04-18: qty 90, 90d supply, fill #1

## 2022-07-05 NOTE — Patient Instructions (Addendum)
1. UTI symptoms  - POCT Urinalysis DIP (Proadvantage Device)  - flush fluids / may try cranberry juice  - UA negative other than trace blood  2. Lipid screening  - Lipid Panel  3. Essential hypertension  - CBC - Comprehensive metabolic panel   Follow up:  Follow up in 6 months

## 2022-07-05 NOTE — Assessment & Plan Note (Signed)
-   POCT Urinalysis DIP (Proadvantage Device)  - flush fluids / may try cranberry juice  - UA negative other than trace blood  2. Lipid screening  - Lipid Panel  3. Essential hypertension  - CBC - Comprehensive metabolic panel   Follow up:  Follow up in 6 months

## 2022-07-05 NOTE — Progress Notes (Signed)
@Patient  ID: Krista [name], female    DOB: March 07, 1975, 47 y.o.   MRN: [id number]  Chief Complaint  Patient presents with   Hypertension   Back Pain   Urinary Frequency    Referring provider: [name], NP   HPI  Patient presents today for follow-up on hypertension. Patient states that she is compliant with medications but blood pressure is elevated.  We will consult pharmacy for medication management.  Patient may end up needing a referral to hypertension specialist. We will trial adding amlodipine.  Patient does complain today of some urinary frequency and low back pain.  She would like to be checked for UTI.  Her UA is negative in office today other than trace blood. denies f/c/s, n/v/d, hemoptysis, PND, leg swelling Denies chest pain or edema      Allergies  Allergen Reactions   Tea Swelling    There is no immunization history for the selected administration types on file for this patient.  Past Medical History:  Diagnosis Date   Hypertension    Migraine     Tobacco History: Social History   Tobacco Use  Smoking Status Former   Types: Cigarettes  Smokeless Tobacco Never   Counseling given: Not Answered   Outpatient Encounter Medications as of 07/05/2022  Medication Sig   amLODipine (NORVASC) 5 MG tablet Take 1 tablet (5 mg total) by mouth daily.   ondansetron (ZOFRAN) 4 MG tablet Take 1 tablet (4 mg total) by mouth every 8 (eight) hours as needed for nausea or vomiting.   tiZANidine (ZANAFLEX) 4 MG tablet Take 1 tablet (4 mg total) by mouth every 6 (six) hours as needed for muscle spasms.   triamterene-hydrochlorothiazide (MAXZIDE-25) 37.5-25 MG tablet Take 1 tablet by mouth daily.   valsartan (DIOVAN) 320 MG tablet Take 1 tablet (320 mg total) by mouth daily.   Facility-Administered Encounter Medications as of 07/05/2022  Medication   cloNIDine (CATAPRES) tablet 0.1 mg     Review of Systems  Review of Systems  Constitutional: Negative.   HENT:  Negative.    Cardiovascular: Negative.   Gastrointestinal: Negative.   Genitourinary:  Positive for frequency.  Allergic/Immunologic: Negative.   Neurological: Negative.   Psychiatric/Behavioral: Negative.         Physical Exam  BP (!) 153/114   Pulse 70   Temp (!) 97.2 F (36.2 C)   Wt 161 lb 9.6 oz (73.3 kg)   SpO2 (!) 77%   BMI 23.86 kg/m   Wt Readings from Last 5 Encounters:  07/05/22 161 lb 9.6 oz (73.3 kg)  01/19/22 149 lb 9.6 oz (67.9 kg)  12/17/21 142 lb (64.4 kg)  07/01/20 160 lb 0.9 oz (72.6 kg)  06/19/20 160 lb (72.6 kg)     Physical Exam Vitals and nursing note reviewed.  Constitutional:      General: She is not in acute distress.    Appearance: She is well-developed.  Cardiovascular:     Rate and Rhythm: Normal rate and regular rhythm.  Pulmonary:     Effort: Pulmonary effort is normal.     Breath sounds: Normal breath sounds.  Neurological:     Mental Status: She is alert and oriented to person, place, and time.      Lab Results:  CBC    Component Value Date/Time   WBC 5.2 01/19/2022 1557   WBC 5.8 09/11/2021 0224   RBC 4.34 01/19/2022 1557   RBC 4.19 09/11/2021 0224   HGB 13.6 01/19/2022 1557  HCT 39.8 01/19/2022 1557   PLT 260 01/19/2022 1557   MCV 92 01/19/2022 1557   MCH 31.3 01/19/2022 1557   MCH 29.1 09/11/2021 0224   MCHC 34.2 01/19/2022 1557   MCHC 31.7 09/11/2021 0224   RDW 11.6 (L) 01/19/2022 1557   LYMPHSABS 1.5 04/28/2020 1135   MONOABS 0.5 04/28/2020 1135   EOSABS 0.1 04/28/2020 1135   BASOSABS 0.0 04/28/2020 1135    BMET    Component Value Date/Time   NA 140 01/19/2022 1557   K 4.1 01/19/2022 1557   CL 102 01/19/2022 1557   CO2 22 01/19/2022 1557   GLUCOSE 84 01/19/2022 1557   GLUCOSE 91 09/11/2021 0224   BUN 10 01/19/2022 1557   CREATININE 0.99 01/19/2022 1557   CALCIUM 9.6 01/19/2022 1557   GFRNONAA >60 09/11/2021 0224   GFRAA >60 09/28/2016 0917     Assessment & Plan:   UTI symptoms - POCT  Urinalysis DIP (Proadvantage Device)  - flush fluids / may try cranberry juice  - UA negative other than trace blood  2. Lipid screening  - Lipid Panel  3. Essential hypertension  - CBC - Comprehensive metabolic panel   Follow up:  Follow up in 6 months     [name], NP 07/05/2022

## 2022-07-06 LAB — LIPID PANEL
Chol/HDL Ratio: 2.7 ratio (ref 0.0–4.4)
Cholesterol, Total: 160 mg/dL (ref 100–199)
HDL: 59 mg/dL (ref >39)
LDL Chol Calc (NIH): 91 mg/dL (ref 0–99)
Triglycerides: 48 mg/dL (ref 0–149)
VLDL Cholesterol Cal: 10 mg/dL (ref 5–40)

## 2022-07-06 LAB — CBC
Hematocrit: 41.3 % (ref 34.0–46.6)
Hemoglobin: 14.2 g/dL (ref 11.1–15.9)
MCH: 32.1 pg (ref 26.6–33.0)
MCHC: 34.4 g/dL (ref 31.5–35.7)
MCV: 93 fL (ref 79–97)
Platelets: 260 10*3/uL (ref 150–450)
RBC: 4.42 x10E6/uL (ref 3.77–5.28)
RDW: 11.1 % — ABNORMAL LOW (ref 11.7–15.4)
WBC: 4.3 10*3/uL (ref 3.4–10.8)

## 2022-07-06 LAB — COMPREHENSIVE METABOLIC PANEL
ALT: 15 IU/L (ref 0–32)
AST: 28 IU/L (ref 0–40)
Albumin: 4.7 g/dL (ref 3.9–4.9)
Alkaline Phosphatase: 60 IU/L (ref 44–121)
BUN/Creatinine Ratio: 13 (ref 9–23)
BUN: 13 mg/dL (ref 6–24)
Bilirubin Total: 0.4 mg/dL (ref 0.0–1.2)
CO2: 25 mmol/L (ref 20–29)
Calcium: 9.9 mg/dL (ref 8.7–10.2)
Chloride: 104 mmol/L (ref 96–106)
Creatinine, Ser: 1.03 mg/dL — ABNORMAL HIGH (ref 0.57–1.00)
Globulin, Total: 3 g/dL (ref 1.5–4.5)
Glucose: 87 mg/dL (ref 70–99)
Potassium: 4.3 mmol/L (ref 3.5–5.2)
Sodium: 141 mmol/L (ref 134–144)
Total Protein: 7.7 g/dL (ref 6.0–8.5)
eGFR: 68 mL/min/{1.73_m2} (ref >59)

## 2022-07-11 ENCOUNTER — Other Ambulatory Visit: Payer: Self-pay

## 2022-08-02 ENCOUNTER — Emergency Department (HOSPITAL_COMMUNITY)
Admission: EM | Admit: 2022-08-02 | Discharge: 2022-08-03 | Disposition: A | Discharge status: Home / Self Care | Payer: Medicaid Other | Attending: Emergency Medicine | Admitting: Emergency Medicine

## 2022-08-02 ENCOUNTER — Ambulatory Visit: Payer: Self-pay | Admitting: Nurse Practitioner

## 2022-08-02 DIAGNOSIS — U071 COVID-19: Secondary | ICD-10-CM | POA: Diagnosis not present

## 2022-08-02 DIAGNOSIS — Z79899 Other long term (current) drug therapy: Secondary | ICD-10-CM | POA: Diagnosis not present

## 2022-08-02 DIAGNOSIS — I10 Essential (primary) hypertension: Secondary | ICD-10-CM | POA: Diagnosis not present

## 2022-08-02 DIAGNOSIS — R509 Fever, unspecified: Secondary | ICD-10-CM | POA: Diagnosis present

## 2022-08-02 NOTE — ED Triage Notes (Signed)
Patient coming to ED for evaluation of fever, body aches, cough, and headache.  Reports symptoms started yesterday.  Fever noted in triage.  Has not taken anything for body aches or fever at home.

## 2022-08-03 ENCOUNTER — Encounter (HOSPITAL_COMMUNITY): Payer: Self-pay | Admitting: Emergency Medicine

## 2022-08-03 ENCOUNTER — Other Ambulatory Visit: Payer: Self-pay

## 2022-08-03 LAB — RESP PANEL BY RT-PCR (RSV, FLU A&B, COVID)  RVPGX2
Influenza A by PCR: NEGATIVE
Influenza B by PCR: NEGATIVE
Resp Syncytial Virus by PCR: NEGATIVE
SARS Coronavirus 2 by RT PCR: POSITIVE — AB

## 2022-08-03 MED ORDER — ACETAMINOPHEN 325 MG PO TABS
650.0000 mg | ORAL_TABLET | Freq: Once | ORAL | Status: AC
  Administered 2022-08-03: 650 mg via ORAL
  Filled 2022-08-03: qty 2

## 2022-08-03 MED ORDER — ONDANSETRON HCL 4 MG PO TABS
4.0000 mg | ORAL_TABLET | Freq: Four times a day (QID) | ORAL | 0 refills | Status: DC
  Filled 2022-08-03 (×2): qty 12, 3d supply, fill #0

## 2022-08-03 MED ORDER — IBUPROFEN 600 MG PO TABS
600.0000 mg | ORAL_TABLET | Freq: Four times a day (QID) | ORAL | 0 refills | Status: AC | PRN
  Filled 2022-08-03 (×2): qty 28, 7d supply, fill #0

## 2022-08-03 NOTE — Discharge Instructions (Addendum)
You have covid,  recommend over-the-counter pain medications like Tylenol for fever and pain control, nasal decongestions like Flonase and Zyrtec, Mucinex for cough.  If not eating recommend supplementing with Gatorade to help with electrolyte supplementation.  I have given you Zofran for nausea take as prescribed.  Follow-up PCP for further evaluation.  Come back to the emergency department if you develop chest pain, shortness of breath, severe abdominal pain, uncontrolled nausea, vomiting, diarrhea.

## 2022-08-03 NOTE — ED Provider Notes (Signed)
[place] EMERGENCY DEPARTMENT AT [facility] Provider Note   CSN: [id number] Arrival date & time: 08/02/22  2320     History  Chief Complaint  Patient presents with   Fever    Krista [name] is a 47 y.o. female.  HPI   Patient with medical history including hypertension, presenting with URI-like symptoms.  Started yesterday, endorses fevers chills, general body aches, as well as decrease in appetite.  Usual nausea without any actual vomiting, denies any chest pain shortness of breath, no stomach pains vomiting constipation diarrhea.  She denies any recent sick contacts, states that she has been having some soup but denies any over-the-counter medications.  She has no other complaints.  Home Medications Prior to Admission medications   Medication Sig Start Date End Date Taking? Authorizing Provider  ibuprofen (ADVIL) 600 MG tablet Take 1 tablet (600 mg total) by mouth every 6 (six) hours as needed for up to 7 days. 08/03/22 08/10/22 Yes Carroll Sage, PA-C  ondansetron (ZOFRAN) 4 MG tablet Take 1 tablet (4 mg total) by mouth every 6 (six) hours. 08/03/22  Yes Carroll Sage, PA-C  amLODipine (NORVASC) 5 MG tablet Take 1 tablet (5 mg total) by mouth daily. 07/05/22   [name], NP  ondansetron (ZOFRAN) 4 MG tablet Take 1 tablet (4 mg total) by mouth every 8 (eight) hours as needed for nausea or vomiting. 07/01/20   Venita Lick, MD  tiZANidine (ZANAFLEX) 4 MG tablet Take 1 tablet (4 mg total) by mouth every 6 (six) hours as needed for muscle spasms. 01/19/22   [name], NP  triamterene-hydrochlorothiazide (MAXZIDE-25) 37.5-25 MG tablet Take 1 tablet by mouth daily. 03/28/22   [name], NP  valsartan (DIOVAN) 320 MG tablet Take 1 tablet (320 mg total) by mouth daily. 02/15/22   [name], NP      Allergies    Tea    Review of Systems   Review of Systems  Constitutional:  Positive for appetite change, chills and fever.  Respiratory:   Negative for shortness of breath.   Cardiovascular:  Negative for chest pain.  Gastrointestinal:  Negative for abdominal pain.  Musculoskeletal:  Positive for myalgias.  Neurological:  Negative for headaches.    Physical Exam Updated Vital Signs BP (!) 172/122 (BP Location: Left Arm)   Pulse 92   Temp 99.7 F (37.6 C) (Oral)   Resp 18   Ht 5\' 9"  (1.753 m)   Wt 73 kg   LMP 11/02/2021   SpO2 100%   BMI 23.78 kg/m  Physical Exam Vitals and nursing note reviewed.  Constitutional:      General: She is not in acute distress.    Appearance: She is not ill-appearing.  HENT:     Head: Normocephalic and atraumatic.     Nose: No congestion.  Eyes:     Conjunctiva/sclera: Conjunctivae normal.  Cardiovascular:     Rate and Rhythm: Normal rate and regular rhythm.     Pulses: Normal pulses.     Heart sounds: No murmur heard.    No friction rub. No gallop.  Pulmonary:     Effort: No respiratory distress.     Breath sounds: No wheezing, rhonchi or rales.  Skin:    General: Skin is warm and dry.  Neurological:     Mental Status: She is alert.  Psychiatric:        Mood and Affect: Mood normal.     ED Results /  Procedures / Treatments   Labs (all labs ordered are listed, but only abnormal results are displayed) Labs Reviewed  RESP PANEL BY RT-PCR (RSV, FLU A&B, COVID)  [id number] - Abnormal; Notable for the following components:      Result Value   SARS Coronavirus 2 by RT PCR POSITIVE (*)    All other components within normal limits    EKG None  Radiology No results found.  Procedures Procedures    Medications Ordered in ED Medications  acetaminophen (TYLENOL) tablet 650 mg (650 mg Oral Given 08/03/22 0019)    ED Course/ Medical Decision Making/ A&P                                 Medical Decision Making  This patient presents to the ED for concern of URI, this involves an extensive number of treatment options, and is a complaint that carries with it a high risk  of complications and morbidity.  The differential diagnosis includes URI, bronchitis, pneumonia, PE,    Additional history obtained:  Additional history obtained from partner at bedside External records from outside source obtained and reviewed including internal medicine notes   Co morbidities that complicate the patient evaluation  Hypertension  Social Determinants of Health:  N/A    Lab Tests:  I Ordered, and personally interpreted labs.  The pertinent results include: Respiratory panel positive for COVID   Imaging Studies ordered:  I ordered imaging studies including N/A I independently visualized and interpreted imaging which showed N/A I agree with the radiologist interpretation   Cardiac Monitoring:  The patient was maintained on a cardiac monitor.  I personally viewed and interpreted the cardiac monitored which showed an underlying rhythm of: N/A   Medicines ordered and prescription drug management:  I ordered medication including Tylenol I have reviewed the patients home medicines and have made adjustments as needed  Critical Interventions:  N/A   Reevaluation:  Presents with URI-like symptoms, triage obtained basic lab workup shows that she is COVID-positive, she was given Tylenol, on my exam resting comfortably, she had a benign physical exam, oral temperature has gone down to 99, they both agree with discharge at this time.  Consultations Obtained:  N/a    Test Considered:  Chest x-ray-defers my special for pneumonia is very low at this time, presentation atypical, lung sounds are clear bilaterally, would not anticipate pneumonia developing in less than 24 hours.    Rule out Low suspicion for systemic infection as patient is nontoxic-appearing, vital signs reassuring, no obvious source infection noted on exam.  Low suspicion for pneumonia as lung sounds are clear bilaterally, x-ray did not reveal any acute findings.  I have low suspicion for  PE as patient denies pleuritic chest pain, shortness of breath,patient is PERC. Low suspicion patient would need  hospitalized due to viral infection or Covid as vital signs reassuring, patient is not in respiratory distress.      Dispostion and problem list  After consideration of the diagnostic results and the patients response to treatment, I feel that the patent would benefit from discharge.  COVID infection-likely the cause of her symptoms, would not recommend antiviral treatment this time she does not have increased risk factors for adverse outcome, recommend symptom management, follow-up with PCP as needed strict turn precautions.            Final Clinical Impression(s) / ED Diagnoses Final diagnoses:  COVID  Rx / DC Orders ED Discharge Orders          Ordered    ibuprofen (ADVIL) 600 MG tablet  Every 6 hours PRN        08/03/22 0133    ondansetron (ZOFRAN) 4 MG tablet  Every 6 hours        08/03/22 0134              Carroll Sage, PA-C 08/03/22 0135    Tilden Fossa, MD 08/03/22 [phone]

## 2022-08-07 ENCOUNTER — Telehealth: Payer: Self-pay

## 2022-08-07 NOTE — Transitions of Care (Post Inpatient/ED Visit) (Signed)
   08/07/2022  Name: Krista [name] MRN: [id number] DOB: September 20, 1975  Today's TOC FU Call Status: Today's TOC FU Call Status:: Unsuccessful Call (1st Attempt) Unsuccessful Call (1st Attempt) Date: 08/07/22  Attempted to reach the patient regarding the most recent Inpatient/ED visit.  Follow Up Plan: Additional outreach attempts will be made to reach the patient to complete the Transitions of Care (Post Inpatient/ED visit) call.   Signature Renelda Loma RMA

## 2022-08-08 ENCOUNTER — Telehealth: Payer: Self-pay

## 2022-08-08 NOTE — Transitions of Care (Post Inpatient/ED Visit) (Signed)
   08/08/2022  Name: Krista [name] MRN: [id number] DOB: [birth year]  Today's TOC FU Call Status: Today's TOC FU Call Status:: Unsuccessful Call (2nd Attempt) Unsuccessful Call (2nd Attempt) Date: 08/08/22  Attempted to reach the patient regarding the most recent Inpatient/ED visit.  Follow Up Plan: Additional outreach attempts will be made to reach the patient to complete the Transitions of Care (Post Inpatient/ED visit) call.   Signature Renelda Loma RMA

## 2022-08-09 ENCOUNTER — Telehealth: Payer: Self-pay

## 2022-08-09 NOTE — Transitions of Care (Post Inpatient/ED Visit) (Cosign Needed)
   08/09/2022  Name: Krista [name] MRN: [id number] DOB: 1975/05/24  Today's TOC FU Call Status: Today's TOC FU Call Status:: Successful TOC FU Call Completed TOC FU Call Complete Date: 08/09/22  Transition Care Management Follow-up Telephone Call Discharge Facility: [facility]) Type of Discharge: Emergency Department Reason for ED Visit: Other: (covid) How have you been since you were released from the hospital?: Better Any questions or concerns?: No  Items Reviewed: Did you receive and understand the discharge instructions provided?: Yes Medications obtained,verified, and reconciled?: Yes (Medications Reviewed) Any new allergies since your discharge?: No Dietary orders reviewed?: NA Do you have support at home?: Yes People in Home: spouse  Medications Reviewed Today: Medications Reviewed Today     Reviewed by Renelda Loma, RMA (Registered Medical Assistant) on 08/09/22 at [phone]  Med List Status: <None>   Medication Order Taking? Sig Documenting Provider Last Dose Status Informant  amLODipine (NORVASC) 5 MG tablet [id number] Yes Take 1 tablet (5 mg total) by mouth daily. [name], NP Taking Active   cloNIDine (CATAPRES) tablet 0.1 mg [id number]   Barbette Merino, NP  Active   ibuprofen (ADVIL) 600 MG tablet [id number] Yes Take 1 tablet (600 mg total) by mouth every 6 (six) hours as needed for up to 7 days. Carroll Sage, PA-C Taking Active   ondansetron [facility]) 4 MG tablet [id number] Yes Take 1 tablet (4 mg total) by mouth every 8 (eight) hours as needed for nausea or vomiting. Venita Lick, MD Taking Active            Med Note Renelda Loma   Thu Jan 19, 2022  3:16 PM) Prn   ondansetron [facility]) 4 MG tablet [id number] Yes Take 1 tablet (4 mg total) by mouth every 6 (six) hours. Carroll Sage, PA-C Taking Active   tiZANidine (ZANAFLEX) 4 MG tablet [id number] Yes Take 1 tablet (4 mg total) by mouth every 6 (six) hours as needed for muscle spasms. [name], NP Taking Active   triamterene-hydrochlorothiazide (MAXZIDE-25) 37.5-25 MG tablet [id number] Yes Take 1 tablet by mouth daily. [name], NP Taking Active   valsartan (DIOVAN) 320 MG tablet [id number] Yes Take 1 tablet (320 mg total) by mouth daily. [name], NP Taking Active             Home Care and Equipment/Supplies: Were Home Health Services Ordered?: NA Any new equipment or medical supplies ordered?: NA  Functional Questionnaire: Do you need assistance with bathing/showering or dressing?: No Do you need assistance with meal preparation?: No Do you need assistance with eating?: No Do you have difficulty maintaining continence: No Do you need assistance with getting out of bed/getting out of a chair/moving?: No Do you have difficulty managing or taking your medications?: No  Follow up appointments reviewed: PCP Follow-up appointment confirmed?: Yes Date of PCP follow-up appointment?: 08/18/22 Follow-up Provider: Nicola Girt Follow-up appointment confirmed?: NA Do you need transportation to your follow-up appointment?: No Do you understand care options if your condition(s) worsen?: Yes-patient verbalized understanding  SDOH Interventions Today    Flowsheet Row Most Recent Value  SDOH Interventions   Food Insecurity Interventions Intervention Not Indicated  Housing Interventions Intervention Not Indicated  Transportation Interventions Intervention Not Indicated  Utilities Interventions Intervention Not Indicated       SIGNATURE. Renelda Loma RMA

## 2022-08-18 ENCOUNTER — Inpatient Hospital Stay: Payer: Medicaid Other | Admitting: Nurse Practitioner

## 2022-08-28 ENCOUNTER — Inpatient Hospital Stay: Payer: Medicaid Other | Admitting: Nurse Practitioner

## 2022-09-06 ENCOUNTER — Other Ambulatory Visit: Payer: Self-pay

## 2022-09-06 ENCOUNTER — Ambulatory Visit: Payer: Medicaid Other | Admitting: Nurse Practitioner

## 2022-09-06 ENCOUNTER — Encounter: Payer: Self-pay | Admitting: Nurse Practitioner

## 2022-09-06 VITALS — BP 160/115 | HR 62 | Temp 98.1°F | Resp 12 | Ht 69.0 in | Wt 166.0 lb

## 2022-09-06 DIAGNOSIS — N951 Menopausal and female climacteric states: Secondary | ICD-10-CM | POA: Diagnosis not present

## 2022-09-06 MED ORDER — PAROXETINE HCL 10 MG PO TABS
10.0000 mg | ORAL_TABLET | Freq: Every day | ORAL | 2 refills | Status: AC
Start: 2022-09-06 — End: ?
  Filled 2022-09-06: qty 30, 30d supply, fill #0

## 2022-09-06 NOTE — Progress Notes (Signed)
@Patient  ID: Krista [name], female    DOB: 1975/03/31, 47 y.o.   MRN: [id number]  Chief Complaint  Patient presents with   Hospitalization Follow-up    Referring provider: No ref. provider found   HPI  Patient presents today for ED follow-up.  She was seen in the ED on 08/02/2022 and diagnosed with COVID.  She states that she has recovered well is having no lingering symptoms.  Patient does complain of hot flashes today.  She states that this has been ongoing for 8 months.  She has not had a menstrual cycle in the past year. We will trial paxil to help with hot flashes. Denies f/c/s, n/v/d, hemoptysis, PND, leg swelling Denies chest pain or edema.     Allergies  Allergen Reactions   Tea Swelling    There is no immunization history for the selected administration types on file for this patient.  Past Medical History:  Diagnosis Date   Hypertension    Migraine     Tobacco History: Social History   Tobacco Use  Smoking Status Former   Types: Cigarettes  Smokeless Tobacco Never   Counseling given: Not Answered   Outpatient Encounter Medications as of 09/06/2022  Medication Sig   amLODipine (NORVASC) 5 MG tablet Take 1 tablet (5 mg total) by mouth daily.   ondansetron (ZOFRAN) 4 MG tablet Take 1 tablet (4 mg total) by mouth every 8 (eight) hours as needed for nausea or vomiting.   ondansetron (ZOFRAN) 4 MG tablet Take 1 tablet (4 mg total) by mouth every 6 (six) hours.   PARoxetine (PAXIL) 10 MG tablet Take 1 tablet (10 mg total) by mouth daily.   tiZANidine (ZANAFLEX) 4 MG tablet Take 1 tablet (4 mg total) by mouth every 6 (six) hours as needed for muscle spasms.   triamterene-hydrochlorothiazide (MAXZIDE-25) 37.5-25 MG tablet Take 1 tablet by mouth daily.   valsartan (DIOVAN) 320 MG tablet Take 1 tablet (320 mg total) by mouth daily.   Facility-Administered Encounter Medications as of 09/06/2022  Medication   cloNIDine (CATAPRES) tablet 0.1 mg     Review of  Systems  Review of Systems  Constitutional: Negative.   HENT: Negative.    Cardiovascular: Negative.   Gastrointestinal: Negative.   Allergic/Immunologic: Negative.   Neurological: Negative.   Psychiatric/Behavioral: Negative.         Physical Exam  BP (!) 160/115 (BP Location: Left Arm, Patient Position: Sitting, Cuff Size: Normal)   Pulse 62   Temp 98.1 F (36.7 C)   Resp 12   Ht 5\' 9"  (1.753 m)   Wt 166 lb (75.3 kg)   LMP 11/02/2021   SpO2 100%   BMI 24.51 kg/m   Wt Readings from Last 5 Encounters:  09/06/22 166 lb (75.3 kg)  08/03/22 161 lb (73 kg)  07/05/22 161 lb 9.6 oz (73.3 kg)  01/19/22 149 lb 9.6 oz (67.9 kg)  12/17/21 142 lb (64.4 kg)     Physical Exam Vitals and nursing note reviewed.  Constitutional:      General: She is not in acute distress.    Appearance: She is well-developed.  Cardiovascular:     Rate and Rhythm: Normal rate and regular rhythm.  Pulmonary:     Effort: Pulmonary effort is normal.     Breath sounds: Normal breath sounds.  Neurological:     Mental Status: She is alert and oriented to person, place, and time.      Lab Results:  CBC  Component Value Date/Time   WBC 4.3 07/05/2022 1440   WBC 5.8 09/11/2021 0224   RBC 4.42 07/05/2022 1440   RBC 4.19 09/11/2021 0224   HGB 14.2 07/05/2022 1440   HCT 41.3 07/05/2022 1440   PLT 260 07/05/2022 1440   MCV 93 07/05/2022 1440   MCH 32.1 07/05/2022 1440   MCH 29.1 09/11/2021 0224   MCHC 34.4 07/05/2022 1440   MCHC 31.7 09/11/2021 0224   RDW 11.1 (L) 07/05/2022 1440   LYMPHSABS 1.5 04/28/2020 1135   MONOABS 0.5 04/28/2020 1135   EOSABS 0.1 04/28/2020 1135   BASOSABS 0.0 04/28/2020 1135    BMET    Component Value Date/Time   NA 141 07/05/2022 1440   K 4.3 07/05/2022 1440   CL 104 07/05/2022 1440   CO2 25 07/05/2022 1440   GLUCOSE 87 07/05/2022 1440   GLUCOSE 91 09/11/2021 0224   BUN 13 07/05/2022 1440   CREATININE 1.03 (H) 07/05/2022 1440   CALCIUM 9.9  07/05/2022 1440   GFRNONAA >60 09/11/2021 0224   GFRAA >60 09/28/2016 0917     Assessment & Plan:   Hot flash, menopausal - PARoxetine (PAXIL) 10 MG tablet; Take 1 tablet (10 mg total) by mouth daily.  Dispense: 30 tablet; Refill: 2   Follow up:  Follow up in 3 months     [name], NP 09/06/2022

## 2022-09-06 NOTE — Patient Instructions (Signed)
1. Hot flash, menopausal  - PARoxetine (PAXIL) 10 MG tablet; Take 1 tablet (10 mg total) by mouth daily.  Dispense: 30 tablet; Refill: 2   Follow up:  Follow up in 3 months

## 2022-09-06 NOTE — Progress Notes (Signed)
Pt is here for hos f/u   Complaining of hot flash and excessive sweating in and off for X2 months

## 2022-09-06 NOTE — Assessment & Plan Note (Signed)
-   PARoxetine (PAXIL) 10 MG tablet; Take 1 tablet (10 mg total) by mouth daily.  Dispense: 30 tablet; Refill: 2   Follow up:  Follow up in 3 months

## 2022-09-08 ENCOUNTER — Other Ambulatory Visit: Payer: Self-pay

## 2022-09-08 NOTE — Progress Notes (Signed)
Patient attempted to be outreached by Sofie Rower, PharmD to discuss hypertension. Left voicemail for patient to return our call at their convenience at [phone].  Sofie Rower, PharmD, [employer] PGY1

## 2022-09-12 ENCOUNTER — Ambulatory Visit: Payer: Medicaid Other | Admitting: Nurse Practitioner

## 2022-09-12 ENCOUNTER — Other Ambulatory Visit: Payer: Self-pay

## 2022-09-14 ENCOUNTER — Encounter: Payer: Self-pay | Admitting: Nurse Practitioner

## 2022-09-14 ENCOUNTER — Ambulatory Visit (INDEPENDENT_AMBULATORY_CARE_PROVIDER_SITE_OTHER): Payer: Medicaid Other | Admitting: Nurse Practitioner

## 2022-09-14 ENCOUNTER — Other Ambulatory Visit: Payer: Self-pay

## 2022-09-14 VITALS — BP 139/100 | HR 75 | Temp 97.0°F | Ht 69.0 in | Wt 162.6 lb

## 2022-09-14 DIAGNOSIS — M25511 Pain in right shoulder: Secondary | ICD-10-CM | POA: Diagnosis not present

## 2022-09-14 DIAGNOSIS — Z23 Encounter for immunization: Secondary | ICD-10-CM | POA: Diagnosis not present

## 2022-09-14 DIAGNOSIS — S0300XA Dislocation of jaw, unspecified side, initial encounter: Secondary | ICD-10-CM | POA: Diagnosis not present

## 2022-09-14 DIAGNOSIS — G43809 Other migraine, not intractable, without status migrainosus: Secondary | ICD-10-CM | POA: Diagnosis not present

## 2022-09-14 MED ORDER — TIZANIDINE HCL 4 MG PO TABS
4.0000 mg | ORAL_TABLET | Freq: Four times a day (QID) | ORAL | 0 refills | Status: DC | PRN
Start: 2022-09-14 — End: 2022-12-13
  Filled 2022-09-14: qty 30, 8d supply, fill #0

## 2022-09-14 MED ORDER — SUMATRIPTAN SUCCINATE 25 MG PO TABS
25.0000 mg | ORAL_TABLET | ORAL | 0 refills | Status: DC | PRN
  Filled 2022-09-14: qty 10, 5d supply, fill #0

## 2022-09-14 NOTE — Patient Instructions (Signed)
1. Need for influenza vaccination  - Flu vaccine trivalent PF, 6mos and older(Flulaval,Afluria,Fluarix,Fluzone)  2. Other migraine without status migrainosus, not intractable  - SUMAtriptan (IMITREX) 25 MG tablet; Take 1 tablet (25 mg total) by mouth every 2 (two) hours as needed for migraine. May repeat in 2 hours if headache persists or recurs.  Dispense: 10 tablet; Refill: 0 - Ambulatory referral to Neurology  3. Acute pain of right shoulder  - tiZANidine (ZANAFLEX) 4 MG tablet; Take 1 tablet (4 mg total) by mouth every 6 (six) hours as needed for muscle spasms.  Dispense: 30 tablet; Refill: 0  4. Dislocation of temporomandibular joint, initial encounter  - tiZANidine (ZANAFLEX) 4 MG tablet; Take 1 tablet (4 mg total) by mouth every 6 (six) hours as needed for muscle spasms.  Dispense: 30 tablet; Refill: 0  Follow up:  Follow up as scheduled

## 2022-09-14 NOTE — Progress Notes (Signed)
Subjective   @Patient  ID: Krista [name], female    DOB: April 26, 1975, 47 y.o.   MRN: [id number]  Chief Complaint  Patient presents with   Headache    Pt also has warming during the head    Referring provider: [name], NP   Headache  This is a new problem. The current episode started 1 to 4 weeks ago. The problem occurs daily. The problem has been unchanged. The pain is located in the Left unilateral and occipital region. The pain radiates to the left shoulder. The pain quality is not similar to prior headaches. The quality of the pain is described as stabbing. The pain is at a severity of 10/10. The pain is severe. Associated symptoms include phonophobia, photophobia and a visual change. Nothing aggravates the symptoms. She has tried NSAIDs for the symptoms. The treatment provided mild relief. Her past medical history is significant for hypertension, migraines in the family and TMJ.   Patient is concerned because she does have family history of aneurysms.  She would like referral to neurology for further evaluation.  We will place referral today.  We will trial Imitrex.  Denies f/c/s, n/v/d, hemoptysis, PND, leg swelling Denies chest pain or edema     Allergies  Allergen Reactions   Tea Swelling    There is no immunization history for the selected administration types on file for this patient.  Past Medical History:  Diagnosis Date   Hypertension    Migraine     Tobacco History: Social History   Tobacco Use  Smoking Status Former   Types: Cigarettes  Smokeless Tobacco Never   Counseling given: Not Answered   Outpatient Encounter Medications as of 09/14/2022  Medication Sig   amLODipine (NORVASC) 5 MG tablet Take 1 tablet (5 mg total) by mouth daily.   ondansetron (ZOFRAN) 4 MG tablet Take 1 tablet (4 mg total) by mouth every 8 (eight) hours as needed for nausea or vomiting.   ondansetron (ZOFRAN) 4 MG tablet Take 1 tablet (4 mg total) by mouth every 6 (six)  hours.   SUMAtriptan (IMITREX) 25 MG tablet Take 1 tablet (25 mg total) by mouth every 2 (two) hours as needed for migraine. May repeat in 2 hours if headache persists or recurs.   triamterene-hydrochlorothiazide (MAXZIDE-25) 37.5-25 MG tablet Take 1 tablet by mouth daily.   valsartan (DIOVAN) 320 MG tablet Take 1 tablet (320 mg total) by mouth daily.   [DISCONTINUED] tiZANidine (ZANAFLEX) 4 MG tablet Take 1 tablet (4 mg total) by mouth every 6 (six) hours as needed for muscle spasms.   PARoxetine (PAXIL) 10 MG tablet Take 1 tablet (10 mg total) by mouth daily. (Patient not taking: Reported on 09/14/2022)   tiZANidine (ZANAFLEX) 4 MG tablet Take 1 tablet (4 mg total) by mouth every 6 (six) hours as needed for muscle spasms.   [DISCONTINUED] cloNIDine (CATAPRES) tablet 0.1 mg    No facility-administered encounter medications on file as of 09/14/2022.    Review of Systems  Review of Systems  Eyes:  Positive for photophobia.  Neurological:  Positive for headaches.     Objective:   BP (!) 139/100   Pulse 75   Temp (!) 97 F (36.1 C)   Ht 5\' 9"  (1.753 m)   Wt 162 lb 9.6 oz (73.8 kg)   LMP 11/02/2021   SpO2 100%   BMI 24.01 kg/m   Wt Readings from Last 5 Encounters:  09/14/22 162 lb 9.6 oz (73.8 kg)  09/06/22  166 lb (75.3 kg)  08/03/22 161 lb (73 kg)  07/05/22 161 lb 9.6 oz (73.3 kg)  01/19/22 149 lb 9.6 oz (67.9 kg)     Physical Exam Vitals and nursing note reviewed.  Constitutional:      General: She is not in acute distress.    Appearance: She is well-developed.  Cardiovascular:     Rate and Rhythm: Normal rate and regular rhythm.  Pulmonary:     Effort: Pulmonary effort is normal.     Breath sounds: Normal breath sounds.  Neurological:     Mental Status: She is alert and oriented to person, place, and time.       Assessment & Plan:   Need for influenza vaccination - Flu vaccine trivalent PF, 6mos and older(Flulaval,Afluria,Fluarix,Fluzone)  2. Other migraine  without status migrainosus, not intractable  - SUMAtriptan (IMITREX) 25 MG tablet; Take 1 tablet (25 mg total) by mouth every 2 (two) hours as needed for migraine. May repeat in 2 hours if headache persists or recurs.  Dispense: 10 tablet; Refill: 0 - Ambulatory referral to Neurology  3. Acute pain of right shoulder  - tiZANidine (ZANAFLEX) 4 MG tablet; Take 1 tablet (4 mg total) by mouth every 6 (six) hours as needed for muscle spasms.  Dispense: 30 tablet; Refill: 0  4. Dislocation of temporomandibular joint, initial encounter  - tiZANidine (ZANAFLEX) 4 MG tablet; Take 1 tablet (4 mg total) by mouth every 6 (six) hours as needed for muscle spasms.  Dispense: 30 tablet; Refill: 0  Follow up:  Follow up as scheduled   Return if symptoms worsen or fail to improve.   [name], NP 09/14/2022

## 2022-09-14 NOTE — Assessment & Plan Note (Signed)
-   Flu vaccine trivalent PF, 6mos and older(Flulaval,Afluria,Fluarix,Fluzone)  2. Other migraine without status migrainosus, not intractable  - SUMAtriptan (IMITREX) 25 MG tablet; Take 1 tablet (25 mg total) by mouth every 2 (two) hours as needed for migraine. May repeat in 2 hours if headache persists or recurs.  Dispense: 10 tablet; Refill: 0 - Ambulatory referral to Neurology  3. Acute pain of right shoulder  - tiZANidine (ZANAFLEX) 4 MG tablet; Take 1 tablet (4 mg total) by mouth every 6 (six) hours as needed for muscle spasms.  Dispense: 30 tablet; Refill: 0  4. Dislocation of temporomandibular joint, initial encounter  - tiZANidine (ZANAFLEX) 4 MG tablet; Take 1 tablet (4 mg total) by mouth every 6 (six) hours as needed for muscle spasms.  Dispense: 30 tablet; Refill: 0  Follow up:  Follow up as scheduled

## 2022-09-18 ENCOUNTER — Other Ambulatory Visit: Payer: Self-pay

## 2022-09-20 ENCOUNTER — Encounter: Payer: Self-pay | Admitting: Neurology
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
## 2022-11-06 ENCOUNTER — Other Ambulatory Visit: Payer: Self-pay

## 2022-11-06 ENCOUNTER — Encounter: Payer: Self-pay | Admitting: Neurology

## 2022-11-06 ENCOUNTER — Ambulatory Visit: Payer: Medicaid Other | Admitting: Neurology

## 2022-11-06 VITALS — BP 162/98 | HR 68 | Ht 69.0 in | Wt 172.0 lb

## 2022-11-06 DIAGNOSIS — I10 Essential (primary) hypertension: Secondary | ICD-10-CM

## 2022-11-06 DIAGNOSIS — G8929 Other chronic pain: Secondary | ICD-10-CM | POA: Diagnosis not present

## 2022-11-06 DIAGNOSIS — M545 Low back pain, unspecified: Secondary | ICD-10-CM

## 2022-11-06 DIAGNOSIS — G43009 Migraine without aura, not intractable, without status migrainosus: Secondary | ICD-10-CM

## 2022-11-06 MED ORDER — RIZATRIPTAN BENZOATE 10 MG PO TABS
10.0000 mg | ORAL_TABLET | ORAL | 5 refills | Status: AC | PRN
  Filled 2022-11-06: qty 10, 30d supply, fill #0

## 2022-11-06 MED ORDER — AJOVY 225 MG/1.5ML ~~LOC~~ SOAJ
225.0000 mg | SUBCUTANEOUS | 11 refills | Status: DC
  Filled 2022-11-06: qty 1.5, 28d supply, fill #0
  Filled 2023-01-23: qty 1.5, 30d supply, fill #0
  Filled 2023-04-18: qty 1.5, 28d supply, fill #0
  Filled 2023-07-30 (×2): qty 1.5, 30d supply, fill #0

## 2022-11-06 NOTE — Patient Instructions (Addendum)
  Refer to physical therapy for low back pain with right radiculopathy Start Ajovy injection every 28 days.  Contact us in 4 weeks with update and we can increase dose if needed. STOP SUMATRIPTAN, BC AND ALL OVER THE COUNTER PAIN RELIEVERS.  Take rizatriptan 10mg  at earliest onset of headache.  May repeat dose once in 2 hours if needed.  Maximum 2 tablets in 24 hours. Limit use of pain relievers to no more than 2 days out of the week.  These medications include acetaminophen, NSAIDs (ibuprofen/Advil/Motrin, naproxen/Aleve, triptans (Imitrex/sumatriptan), Excedrin, and narcotics.  This will help reduce risk of rebound headaches. Be aware of common food triggers Routine exercise Stay adequately hydrated (aim for 64 oz water daily) Keep headache diary Maintain proper stress management Maintain proper sleep hygiene Do not skip meals Consider supplements:  magnesium citrate 400mg  daily, riboflavin 400mg  daily, coenzyme Q10 300mg  daily Follow up with your PCP regarding your blood pressure

## 2022-11-10 ENCOUNTER — Ambulatory Visit: Payer: No Typology Code available for payment source | Attending: Neurology | Admitting: Physical Therapy

## 2022-11-10 ENCOUNTER — Encounter: Payer: Self-pay | Admitting: Physical Therapy

## 2022-11-10 VITALS — BP 148/101 | HR 68

## 2022-11-10 DIAGNOSIS — R2689 Other abnormalities of gait and mobility: Secondary | ICD-10-CM | POA: Diagnosis present

## 2022-11-10 DIAGNOSIS — M6281 Muscle weakness (generalized): Secondary | ICD-10-CM | POA: Insufficient documentation

## 2022-11-10 DIAGNOSIS — R293 Abnormal posture: Secondary | ICD-10-CM | POA: Insufficient documentation

## 2022-11-10 DIAGNOSIS — M5459 Other low back pain: Secondary | ICD-10-CM | POA: Insufficient documentation

## 2022-11-10 DIAGNOSIS — G8929 Other chronic pain: Secondary | ICD-10-CM | POA: Diagnosis not present

## 2022-11-10 DIAGNOSIS — M545 Low back pain, unspecified: Secondary | ICD-10-CM | POA: Diagnosis not present
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
## 2022-11-13 ENCOUNTER — Ambulatory Visit: Payer: Medicaid Other | Admitting: Physical Therapy

## 2022-11-14 ENCOUNTER — Other Ambulatory Visit: Payer: Self-pay

## 2022-11-16 ENCOUNTER — Other Ambulatory Visit: Payer: Self-pay

## 2022-11-17 ENCOUNTER — Other Ambulatory Visit: Payer: Self-pay

## 2022-11-20 ENCOUNTER — Other Ambulatory Visit: Payer: Self-pay

## 2022-11-21 ENCOUNTER — Ambulatory Visit: Payer: No Typology Code available for payment source | Admitting: Physical Therapy

## 2022-11-21 ENCOUNTER — Other Ambulatory Visit: Payer: Self-pay

## 2022-11-21 VITALS — BP 148/95 | HR 63

## 2022-11-21 DIAGNOSIS — R2689 Other abnormalities of gait and mobility: Secondary | ICD-10-CM

## 2022-11-21 DIAGNOSIS — M5459 Other low back pain: Secondary | ICD-10-CM

## 2022-11-21 DIAGNOSIS — M6281 Muscle weakness (generalized): Secondary | ICD-10-CM | POA: Diagnosis not present

## 2022-11-21 NOTE — Therapy (Signed)
OUTPATIENT PHYSICAL THERAPY THORACOLUMBAR TREATMENT   Patient Name: Krista [name] MRN: [id number] DOB:May 18, 1975, 47 y.o., female Today's Date: 11/21/2022  END OF SESSION:  PT End of Session - 11/21/22 1533     Visit Number 2    Number of Visits 9   Plus eval   Date for PT Re-Evaluation 01/19/23    Authorization Type Med Pay    PT Start Time 1532    PT Stop Time 1612    PT Time Calculation (min) 40 min    Activity Tolerance Patient tolerated treatment well    Behavior During Therapy [facility] for tasks assessed/performed              Past Medical History:  Diagnosis Date   Hypertension    Migraine    Past Surgical History:  Procedure Laterality Date   LUMBAR LAMINECTOMY/DECOMPRESSION MICRODISCECTOMY N/A 07/01/2020   Procedure: LUMBAR LAMINECTOMY/DECOMPRESSION MICRODISCECTOMY 1 LEVEL;  Surgeon: Venita Lick, MD;  Location: [facility] OR;  Service: Orthopedics;  Laterality: N/A;   NO PAST SURGERIES     Patient Active Problem List   Diagnosis Date Noted   Need for influenza vaccination 09/14/2022   Hot flash, menopausal 09/06/2022   UTI symptoms 07/05/2022   Colon cancer screening 01/26/2022   Essential hypertension 06/27/2020   Menorrhagia 05/26/2020   Uterine fibroid 05/26/2020    PCP: [name], NP   REFERRING PROVIDER: Drema Dallas, DO  REFERRING DIAG: [phone] (ICD-10-CM) - Chronic right-sided low back pain, unspecified whether sciatica present  Rationale for Evaluation and Treatment: Rehabilitation  THERAPY DIAG:  Muscle weakness (generalized)  Other low back pain  Other abnormalities of gait and mobility  ONSET DATE: 11/06/2022  SUBJECTIVE:                                                                                                                                                                                           SUBJECTIVE STATEMENT: Pt reports her back pain kept her up all night. Got stuck at work last week and missed her  appointment. Denies acute changes.   PERTINENT HISTORY:  Lumbar laminectomy in 2022   PAIN:  Are you having pain? Yes: NPRS scale: 8/10 Pain location: Low back Pain description: Stabbing  Aggravating factors: Standing for prolonged periods, bending forward Relieving factors: Muscle relaxants   PRECAUTIONS: Fall  RED FLAGS: None   WEIGHT BEARING RESTRICTIONS: No  FALLS:  Has patient fallen in last 6 months? No  LIVING ENVIRONMENT: Lives with: lives with their family Lives in: House/apartment Stairs: Yes: External: 3 steps; on right going up, on left going up, and can reach both Has following  equipment at home: shower chair  OCCUPATION: Home Health nurse   PLOF: Independent  PATIENT GOALS: "Make my back stop hurting"   NEXT MD VISIT: 12/11  OBJECTIVE:  Note: Objective measures were completed at Evaluation unless otherwise noted.  DIAGNOSTIC FINDINGS:  No updated imaging on file   PATIENT SURVEYS:  Modified Oswestry 23/50   SCREENING FOR RED FLAGS: Bowel or bladder incontinence: No Spinal tumors: No Cauda equina syndrome: No Compression fracture: No Abdominal aneurysm: No  COGNITION: Overall cognitive status: Within functional limits for tasks assessed     SENSATION: Pt reports frequent numbness/tingling of bilateral feet and entirety of L arm. Pinprick sensation diminished on bilateral feet and hands   POSTURE: forward head and flexed trunk    LUMBAR ROM:   AROM eval  Flexion Lacking >50% ROM, pain  Extension Lacking >75% ROM, pain  Right lateral flexion Lacking <25% ROM,  Left lateral flexion Lacking ~50% ROM, pain  Right rotation WNL  Left rotation WNL    (Blank rows = not tested)   LOWER EXTREMITY MMT:  Tested in seated position   MMT Right eval Left eval  Hip flexion 5 3+  Hip extension    Hip abduction 4+ 4+  Hip adduction 2- 2-  Hip internal rotation    Hip external rotation    Knee flexion 4 4-  Knee extension 4 4  Ankle  dorsiflexion 5 5  Ankle plantarflexion    Ankle inversion    Ankle eversion     (Blank rows = not tested)  LUMBAR SPECIAL TESTS:  Slump test: Negative  GAIT: Distance walked: Various clinic distances  Assistive device utilized: None Level of assistance: Modified independence Comments: Excessive lumbar lordosis noted  VITALS  Vitals:   11/21/22 1535  BP: (!) 148/95  Pulse: 63      TODAY'S TREATMENT:         Ther Act  Assessed BP (see above) and BP elevated but within limits for therapy   Ther Ex SciFit multi-peaks level 6 for 8 minutes using BUE/BLEs for neural priming for reciprocal movement, dynamic cardiovascular warmup and global strengthening. RPE of 8/10 following activity  The following exercises were performed for improved spinal mobility, pain modulation and core stability. Added to HEP (see bolded below):  PPT, x15 reps w/2s isometric hold  SKTC, 4x20s per side. Increased difficulty on LLE > RLE LTRs, x10 per side Glute bridge w/ppt, x10 reps.   Pt rated pain as 4/10 at end of session    PATIENT EDUCATION:  Education details: Initial HEP Person educated: Patient Education method: Explanation, Demonstration, and Handouts Education comprehension: verbalized understanding, returned demonstration, and needs further education  HOME EXERCISE PROGRAM: Access Code: [email or web address] URL: [email or web address] Date: 11/21/2022 Prepared by: Alethia Berthold Jordanne Elsbury  Exercises - Posterior pelvic tilt  - 1 x daily - 7 x weekly - 3 sets - 10 reps - Supine Single Knee to Chest Stretch  - 1 x daily - 7 x weekly - 3 sets - 20-30 second hold - Lower Trunk Rotation Stretch  - 1 x daily - 7 x weekly - 3 sets - 10 reps - Supine Bridge with Pelvic Floor Contraction  - 1 x daily - 7 x weekly - 3 sets - 10 reps  ASSESSMENT:  CLINICAL IMPRESSION: Emphasis of skilled PT session on global strength, spinal mobility and establishing initial HEP. Pt reporting 8/10 low back pain at  beginning of session that did reduce to 4/10 w/exercise and stretching. Noted LLE trembling  w/activity, especially glute bridges. Continue POC.    OBJECTIVE IMPAIRMENTS: Abnormal gait, decreased activity tolerance, decreased endurance, decreased mobility, difficulty walking, decreased ROM, decreased strength, impaired flexibility, impaired sensation, improper body mechanics, postural dysfunction, and pain  ACTIVITY LIMITATIONS: carrying, lifting, bending, standing, squatting, stairs, bed mobility, locomotion level, and caring for others  PARTICIPATION LIMITATIONS: cleaning, shopping, community activity, occupation, and yard work  PERSONAL FACTORS: Fitness, Past/current experiences, and 1 comorbidity: Lumbar laminectomy  are also affecting patient's functional outcome.   REHAB POTENTIAL: Good  CLINICAL DECISION MAKING: Stable/uncomplicated  EVALUATION COMPLEXITY: Low   GOALS: Goals reviewed with patient? Yes  SHORT TERM GOALS: Target date: 12/08/2022   Pt will be independent with initial HEP for improved strength, transfers and ROM.  Baseline: not established on eval Goal status: INITIAL  2.  Pt will demonstrate ability to lie prone w/<3/10 pain for improved lumbar extension and reduced pain levels  Baseline: unable to tolerate prone  Goal status: INITIAL  3.  Pt will score </= 16/50 on Modified Oswestry for reduced pain levels  Baseline: 23/50 Goal status: INITIAL  LONG TERM GOALS: Target date: 01/05/2023   Pt will be independent with final HEP for improved strength, transfers and ROM.  Baseline:  Goal status: INITIAL  2.   Pt will score </= 10/50 on Modified Oswestry for reduced pain levels  Baseline: 23/50 Goal status: INITIAL   PLAN:  PT FREQUENCY: 1x/week  PT DURATION: 8 weeks  PLANNED INTERVENTIONS: 97164- PT Re-evaluation, 97110-Therapeutic exercises, 97530- Therapeutic activity, 97112- Neuromuscular re-education, 97535- Self Care, 16109- Manual therapy,  [phone]- Gait training, [phone]- Aquatic Therapy, [phone]- Electrical stimulation (manual), Patient/Family education, Balance training, Dry Needling, Joint mobilization, and Spinal mobilization.  PLAN FOR NEXT SESSION: Check BP, TPDN to low back. Work on gentle low back exercises, lying prone if able. Posterior chain strength  Check all possible CPT codes: See Planned Interventions List for Planned CPT Codes    Check all conditions that are expected to impact treatment: Structural or anatomic abnormalities   If treatment provided at initial evaluation, no treatment charged due to lack of authorization.     Jill Alexanders Jadyn Barge, PT, DPT 11/21/2022, 4:13 PM

## 2022-11-22 ENCOUNTER — Other Ambulatory Visit: Payer: Self-pay

## 2022-11-23 ENCOUNTER — Other Ambulatory Visit: Payer: Self-pay

## 2022-11-27 ENCOUNTER — Other Ambulatory Visit: Payer: Self-pay

## 2022-11-29 ENCOUNTER — Other Ambulatory Visit: Payer: Self-pay

## 2022-12-01 ENCOUNTER — Other Ambulatory Visit: Payer: Self-pay

## 2022-12-05 ENCOUNTER — Ambulatory Visit: Payer: Medicaid Other | Attending: Nurse Practitioner | Admitting: Physical Therapy

## 2022-12-11 ENCOUNTER — Other Ambulatory Visit: Payer: Self-pay

## 2022-12-12 ENCOUNTER — Ambulatory Visit: Payer: Medicaid Other | Admitting: Physical Therapy

## 2022-12-12 NOTE — Therapy (Incomplete)
OUTPATIENT PHYSICAL THERAPY THORACOLUMBAR TREATMENT   Patient Name: Krista [name] MRN: [id number] DOB:10/19/1975, 47 y.o., female Today's Date: 12/12/2022  END OF SESSION:     Past Medical History:  Diagnosis Date   Hypertension    Migraine    Past Surgical History:  Procedure Laterality Date   LUMBAR LAMINECTOMY/DECOMPRESSION MICRODISCECTOMY N/A 07/01/2020   Procedure: LUMBAR LAMINECTOMY/DECOMPRESSION MICRODISCECTOMY 1 LEVEL;  Surgeon: Venita Lick, MD;  Location: [facility] OR;  Service: Orthopedics;  Laterality: N/A;   NO PAST SURGERIES     Patient Active Problem List   Diagnosis Date Noted   Need for influenza vaccination 09/14/2022   Hot flash, menopausal 09/06/2022   UTI symptoms 07/05/2022   Colon cancer screening 01/26/2022   Essential hypertension 06/27/2020   Menorrhagia 05/26/2020   Uterine fibroid 05/26/2020    PCP: [name], NP   REFERRING PROVIDER: Drema Dallas, DO  REFERRING DIAG: [phone] (ICD-10-CM) - Chronic right-sided low back pain, unspecified whether sciatica present  Rationale for Evaluation and Treatment: Rehabilitation  THERAPY DIAG:  No diagnosis found.  ONSET DATE: 11/06/2022  SUBJECTIVE:                                                                                                                                                                                           SUBJECTIVE STATEMENT: ***  PERTINENT HISTORY:  Lumbar laminectomy in 2022   PAIN:  Are you having pain? Yes: NPRS scale: 8/10 Pain location: Low back Pain description: Stabbing  Aggravating factors: Standing for prolonged periods, bending forward Relieving factors: Muscle relaxants   PRECAUTIONS: Fall  RED FLAGS: None   WEIGHT BEARING RESTRICTIONS: No  FALLS:  Has patient fallen in last 6 months? No  LIVING ENVIRONMENT: Lives with: lives with their family Lives in: House/apartment Stairs: Yes: External: 3 steps; on right going up, on left  going up, and can reach both Has following equipment at home: shower chair  OCCUPATION: Home Health nurse   PLOF: Independent  PATIENT GOALS: "Make my back stop hurting"   NEXT MD VISIT: 12/11  OBJECTIVE:  Note: Objective measures were completed at Evaluation unless otherwise noted.  DIAGNOSTIC FINDINGS:  No updated imaging on file   PATIENT SURVEYS:  Modified Oswestry 23/50   SCREENING FOR RED FLAGS: Bowel or bladder incontinence: No Spinal tumors: No Cauda equina syndrome: No Compression fracture: No Abdominal aneurysm: No  COGNITION: Overall cognitive status: Within functional limits for tasks assessed     SENSATION: Pt reports frequent numbness/tingling of bilateral feet and entirety of L arm. Pinprick sensation diminished on bilateral feet and hands   POSTURE: forward head and flexed trunk  LUMBAR ROM:   AROM eval  Flexion Lacking >50% ROM, pain  Extension Lacking >75% ROM, pain  Right lateral flexion Lacking <25% ROM,  Left lateral flexion Lacking ~50% ROM, pain  Right rotation WNL  Left rotation WNL    (Blank rows = not tested)   LOWER EXTREMITY MMT:  Tested in seated position   MMT Right eval Left eval  Hip flexion 5 3+  Hip extension    Hip abduction 4+ 4+  Hip adduction 2- 2-  Hip internal rotation    Hip external rotation    Knee flexion 4 4-  Knee extension 4 4  Ankle dorsiflexion 5 5  Ankle plantarflexion    Ankle inversion    Ankle eversion     (Blank rows = not tested)  LUMBAR SPECIAL TESTS:  Slump test: Negative  GAIT: Distance walked: Various clinic distances  Assistive device utilized: None Level of assistance: Modified independence Comments: Excessive lumbar lordosis noted  VITALS  There were no vitals filed for this visit.     TODAY'S TREATMENT:         Ther Act  Assessed BP (see above) and BP elevated but within limits for therapy ***  Ther Ex ***   PATIENT EDUCATION:  Education details: Initial  HEP*** Person educated: Patient Education method: Explanation, Demonstration, and Handouts Education comprehension: verbalized understanding, returned demonstration, and needs further education  HOME EXERCISE PROGRAM: Access Code: [email or web address] URL: [email or web address] Date: 11/21/2022 Prepared by: Alethia Berthold Plaster  Exercises - Posterior pelvic tilt  - 1 x daily - 7 x weekly - 3 sets - 10 reps - Supine Single Knee to Chest Stretch  - 1 x daily - 7 x weekly - 3 sets - 20-30 second hold - Lower Trunk Rotation Stretch  - 1 x daily - 7 x weekly - 3 sets - 10 reps - Supine Bridge with Pelvic Floor Contraction  - 1 x daily - 7 x weekly - 3 sets - 10 reps  ASSESSMENT:  CLINICAL IMPRESSION: Emphasis of skilled PT session on ***Continue POC.    OBJECTIVE IMPAIRMENTS: Abnormal gait, decreased activity tolerance, decreased endurance, decreased mobility, difficulty walking, decreased ROM, decreased strength, impaired flexibility, impaired sensation, improper body mechanics, postural dysfunction, and pain  ACTIVITY LIMITATIONS: carrying, lifting, bending, standing, squatting, stairs, bed mobility, locomotion level, and caring for others  PARTICIPATION LIMITATIONS: cleaning, shopping, community activity, occupation, and yard work  PERSONAL FACTORS: Fitness, Past/current experiences, and 1 comorbidity: Lumbar laminectomy  are also affecting patient's functional outcome.   REHAB POTENTIAL: Good  CLINICAL DECISION MAKING: Stable/uncomplicated  EVALUATION COMPLEXITY: Low   GOALS: Goals reviewed with patient? Yes  SHORT TERM GOALS: Target date: 12/08/2022***   Pt will be independent with initial HEP for improved strength, transfers and ROM.  Baseline: not established on eval Goal status: INITIAL  2.  Pt will demonstrate ability to lie prone w/<3/10 pain for improved lumbar extension and reduced pain levels  Baseline: unable to tolerate prone  Goal status: INITIAL  3.  Pt  will score </= 16/50 on Modified Oswestry for reduced pain levels  Baseline: 23/50 Goal status: INITIAL  LONG TERM GOALS: Target date: 01/05/2023   Pt will be independent with final HEP for improved strength, transfers and ROM.  Baseline:  Goal status: INITIAL  2.   Pt will score </= 10/50 on Modified Oswestry for reduced pain levels  Baseline: 23/50 Goal status: INITIAL   PLAN:  PT FREQUENCY: 1x/week  PT DURATION: 8 weeks  PLANNED INTERVENTIONS:  44010- PT Re-evaluation, 97110-Therapeutic exercises, 97530- Therapeutic activity, [id number]- Neuromuscular re-education, 97535- Self Care, 27253- Manual therapy, [phone]- Gait training, [phone]- Aquatic Therapy, [phone]- Electrical stimulation (manual), Patient/Family education, Balance training, Dry Needling, Joint mobilization, and Spinal mobilization.  PLAN FOR NEXT SESSION: Check BP, TPDN to low back. Work on gentle low back exercises, lying prone if able. Posterior chain strength***  Check all possible CPT codes: See Planned Interventions List for Planned CPT Codes    Check all conditions that are expected to impact treatment: Structural or anatomic abnormalities   If treatment provided at initial evaluation, no treatment charged due to lack of authorization.     Peter Congo, PT Peter Congo, PT, DPT, CSRS  12/12/2022, 9:54 AM

## 2022-12-13 ENCOUNTER — Other Ambulatory Visit: Payer: Self-pay

## 2022-12-13 ENCOUNTER — Encounter: Payer: Self-pay | Admitting: Nurse Practitioner

## 2022-12-13 ENCOUNTER — Ambulatory Visit: Payer: Medicaid Other | Admitting: Nurse Practitioner

## 2022-12-13 VITALS — BP 169/126 | HR 66 | Temp 97.1°F | Wt 169.8 lb

## 2022-12-13 DIAGNOSIS — Z23 Encounter for immunization: Secondary | ICD-10-CM

## 2022-12-13 DIAGNOSIS — I10 Essential (primary) hypertension: Secondary | ICD-10-CM | POA: Diagnosis not present

## 2022-12-13 DIAGNOSIS — M545 Low back pain, unspecified: Secondary | ICD-10-CM

## 2022-12-13 DIAGNOSIS — G8929 Other chronic pain: Secondary | ICD-10-CM

## 2022-12-13 MED ORDER — TIZANIDINE HCL 4 MG PO TABS
4.0000 mg | ORAL_TABLET | Freq: Four times a day (QID) | ORAL | 0 refills | Status: DC | PRN
  Filled 2022-12-13: qty 30, 8d supply, fill #0

## 2022-12-13 MED ORDER — CLONIDINE HCL 0.1 MG PO TABS: 0.1000 mg | ORAL_TABLET | Freq: Once | ORAL | Status: DC

## 2022-12-13 NOTE — Patient Instructions (Signed)
1. Need for tetanus booster  - Td : Tetanus/diphtheria >46yo Preservative  free  2. Essential hypertension  - CBC - Comprehensive metabolic panel  3. Primary hypertension  - CBC - Comprehensive metabolic panel  4. Chronic midline low back pain, unspecified whether sciatica present  - tiZANidine (ZANAFLEX) 4 MG tablet; Take 1 tablet (4 mg total) by mouth every 6 (six) hours as needed for muscle spasms.  Dispense: 30 tablet; Refill: 0  Follow up:  Follow up in 6 months

## 2022-12-13 NOTE — Progress Notes (Unsigned)
Subjective   Patient ID: Krista [name], female    DOB: 19-Jul-1975, 47 y.o.   MRN: [id number]  Chief Complaint  Patient presents with   Follow-up    3 month follow up for hot flashes     Referring provider: [name], NP  Krista [name] is a 47 y.o. female with Past Medical History: No date: Hypertension No date: Migraine   HPI  Patient presents today for follow-up visit.  Overall she has been doing well since her last visit here.  Patient states that she is no longer having issues with hot flashes.  She states that she did take her blood pressure medicine today but it was elevated.  Patient declined to stay to get clonidine per protocol because she has to go to work.  We will have pharmacy follow-up with her for medication management. Denies f/c/s, n/v/d, hemoptysis, PND, leg swelling Denies chest pain or edema     Allergies  Allergen Reactions   Tea Swelling    Immunization History  Administered Date(s) Administered   Influenza, Seasonal, Injecte, Preservative Fre 09/14/2022    Tobacco History: Social History   Tobacco Use  Smoking Status Former   Types: Cigarettes  Smokeless Tobacco Never   Counseling given: Not Answered   Outpatient Encounter Medications as of 12/13/2022  Medication Sig   amLODipine (NORVASC) 5 MG tablet Take 1 tablet (5 mg total) by mouth daily.   Fremanezumab-vfrm (AJOVY) 225 MG/1.5ML SOAJ Inject 225 mg into the skin every 28 (twenty-eight) days.   gabapentin (NEURONTIN) 300 MG capsule TAKE 1 CAPSULE 3 TIMES A DAY BY ORAL ROUTE AS DIRECTED FOR 15 DAYS.   MY WAY 1.5 MG tablet Take by mouth.   ondansetron (ZOFRAN) 4 MG tablet Take 1 tablet (4 mg total) by mouth every 8 (eight) hours as needed for nausea or vomiting.   PARoxetine (PAXIL) 10 MG tablet Take 1 tablet (10 mg total) by mouth daily.   rizatriptan (MAXALT) 10 MG tablet Take 1 tablet (10 mg total) by mouth as needed for migraine. May repeat in 2 hours if needed.  Maximum 2 tablets  in 24 hours.   triamterene-hydrochlorothiazide (MAXZIDE-25) 37.5-25 MG tablet Take 1 tablet by mouth daily.   valsartan (DIOVAN) 320 MG tablet Take 1 tablet (320 mg total) by mouth daily.   [DISCONTINUED] tiZANidine (ZANAFLEX) 4 MG tablet Take 1 tablet (4 mg total) by mouth every 6 (six) hours as needed for muscle spasms.   ondansetron (ZOFRAN) 4 MG tablet Take 1 tablet (4 mg total) by mouth every 6 (six) hours. (Patient not taking: Reported on 11/06/2022)   tiZANidine (ZANAFLEX) 4 MG tablet Take 1 tablet (4 mg total) by mouth every 6 (six) hours as needed for muscle spasms.   [DISCONTINUED] cloNIDine (CATAPRES) tablet 0.1 mg    No facility-administered encounter medications on file as of 12/13/2022.    Review of Systems  Review of Systems  Constitutional: Negative.   HENT: Negative.    Cardiovascular: Negative.   Gastrointestinal: Negative.   Allergic/Immunologic: Negative.   Neurological: Negative.   Psychiatric/Behavioral: Negative.       Objective:   BP (!) 169/126   Pulse 66   Temp (!) 97.1 F (36.2 C)   Wt 169 lb 12.8 oz (77 kg)   LMP 11/02/2021   SpO2 100%   BMI 25.08 kg/m   Wt Readings from Last 5 Encounters:  12/13/22 169 lb 12.8 oz (77 kg)  11/06/22 172 lb (78 kg)  09/14/22 162  lb 9.6 oz (73.8 kg)  09/06/22 166 lb (75.3 kg)  08/03/22 161 lb (73 kg)     Physical Exam Vitals and nursing note reviewed.  Constitutional:      General: She is not in acute distress.    Appearance: She is well-developed.  Cardiovascular:     Rate and Rhythm: Normal rate and regular rhythm.  Pulmonary:     Effort: Pulmonary effort is normal.     Breath sounds: Normal breath sounds.  Neurological:     Mental Status: She is alert and oriented to person, place, and time.      Assessment & Plan:   Essential hypertension -     CBC -     Comprehensive metabolic panel -     AMB Referral [facility] Care Management  Need for tetanus booster -     Td vaccine greater than or equal  to 7yo preservative free IM  Chronic midline low back pain, unspecified whether sciatica present -     tiZANidine HCl; Take 1 tablet (4 mg total) by mouth every 6 (six) hours as needed for muscle spasms.  Dispense: 30 tablet; Refill: 0     Return in about 6 months (around 06/13/2023).   [name], NP 12/13/2022

## 2022-12-14 LAB — COMPREHENSIVE METABOLIC PANEL
ALT: 12 [IU]/L (ref 0–32)
AST: 19 [IU]/L (ref 0–40)
Albumin: 4.5 g/dL (ref 3.9–4.9)
Alkaline Phosphatase: 68 [IU]/L (ref 44–121)
BUN/Creatinine Ratio: 10 (ref 9–23)
BUN: 10 mg/dL (ref 6–24)
Bilirubin Total: 0.4 mg/dL (ref 0.0–1.2)
CO2: 24 mmol/L (ref 20–29)
Calcium: 9.3 mg/dL (ref 8.7–10.2)
Chloride: 105 mmol/L (ref 96–106)
Creatinine, Ser: 1.05 mg/dL — ABNORMAL HIGH (ref 0.57–1.00)
Globulin, Total: 2.7 g/dL (ref 1.5–4.5)
Glucose: 89 mg/dL (ref 70–99)
Potassium: 4.1 mmol/L (ref 3.5–5.2)
Sodium: 142 mmol/L (ref 134–144)
Total Protein: 7.2 g/dL (ref 6.0–8.5)
eGFR: 66 mL/min/{1.73_m2} (ref >59)

## 2022-12-14 LAB — CBC
Hematocrit: 39.3 % (ref 34.0–46.6)
Hemoglobin: 13.1 g/dL (ref 11.1–15.9)
MCH: 31.6 pg (ref 26.6–33.0)
MCHC: 33.3 g/dL (ref 31.5–35.7)
MCV: 95 fL (ref 79–97)
Platelets: 238 10*3/uL (ref 150–450)
RBC: 4.14 x10E6/uL (ref 3.77–5.28)
RDW: 11.8 % (ref 11.7–15.4)
WBC: 4.6 10*3/uL (ref 3.4–10.8)

## 2022-12-14 NOTE — Progress Notes (Signed)
My chart message was sent to pt after confirming last activity date.  KH

## 2022-12-15 ENCOUNTER — Other Ambulatory Visit: Payer: Self-pay

## 2022-12-21 ENCOUNTER — Ambulatory Visit: Payer: Medicaid Other | Admitting: Physical Therapy

## 2022-12-22 ENCOUNTER — Telehealth: Payer: Self-pay | Admitting: Physical Therapy

## 2022-12-22 NOTE — Telephone Encounter (Signed)
Called and spoke to pt regarding 3rd no-show to PT appointment. Pt reports she has been sick and could not make it out. Informed pt to call clinic if she cannot make her appointment ahead of time, as per our no-show policy, she should be DC. Informed pt of next appointment time and date and if she does not show, will be DC. Pt verbalized understanding.   Jill Alexanders Hayven Fatima, PT, DPT

## 2023-01-04 ENCOUNTER — Ambulatory Visit: Payer: Medicaid Other

## 2023-01-09 ENCOUNTER — Ambulatory Visit: Payer: Medicaid Other | Admitting: Physical Therapy

## 2023-01-16 ENCOUNTER — Ambulatory Visit: Payer: Medicaid Other | Admitting: Physical Therapy

## 2023-01-21 ENCOUNTER — Other Ambulatory Visit: Payer: Self-pay

## 2023-01-21 ENCOUNTER — Other Ambulatory Visit (HOSPITAL_COMMUNITY): Payer: Self-pay

## 2023-01-21 NOTE — Progress Notes (Signed)
01/21/2023 Name: Krista [name] MRN: [id number] DOB: [birth year]  Chief Complaint  Patient presents with   Hypertension   Medication Management    Krista [name] is a 48 y.o. year old female who presented for a telephone visit.   They were referred to the pharmacist by their PCP for assistance in managing hypertension.    Subjective: Patient reports feeling well today. She was not at home during the visit so unable to verify medication bottles.  Care Team: Primary Care Provider: [name], NP ; Next Scheduled Visit: 06/13/2023  Medication Access/Adherence  Current Pharmacy:  [facility] [address], [address] [place] [address] Phone: [phone] Fax: [phone]   Patient reports affordability concerns with their medications: Yes  Patient reports access/transportation concerns to their pharmacy: No  Patient reports adherence concerns with their medications:  No     Hypertension:  Current medications:  - amlodipine 5 mg daily- takes 1/2 tablet daily in afternoon - valsartan 320 mg daily- patient stopped taking due to throat swelling and rash on her leg - triamterene-hydrochlorothiazide 37.5-25 mg daily - Patient not taking, reports she thought this was changed to another medication, also endorsed more frequent urination while taking this that was bothersome to her  Patient reports that amlodipine 5 mg made her stomach upset so she decreased to 1/2 tablet daily and this resolved. Patient adamant that she takes 1/2 tablet of amlodipine daily, although this has not been filled since 2023. When asked about fill timeline she denied missed doses and stated she had some leftover since she was only taking 1/2 tablet daily. States that when she took valsartan she developed a rash on her leg and swelling in her throat. She denied SOB during this episode. Patient reported she treated with Benadryl and stopped the medication and  symptoms resolved.   Patient has a, automated, wrist home BP cuff. She has not brought cuff in for validation. Endorses proper BP check technique- rests prior to checking, feet flat on the floor, comfortable position. Current blood pressure readings readings: 138-140s/98-99  Patient denies hypotensive s/sx including dizziness, lightheadedness. Patient denies hypertensive symptoms including headache, chest pain, shortness of breath  Current meal patterns:  - Does not add salt to food - Drinks 2 cans of pepsi per day  Current physical activity: no current exercise, limited by back pain   Objective:  Lab Results  Component Value Date   CREATININE 1.05 (H) 12/13/2022   BUN 10 12/13/2022   NA 142 12/13/2022   K 4.1 12/13/2022   CL 105 12/13/2022   CO2 24 12/13/2022    Lab Results  Component Value Date   CHOL 160 07/05/2022   HDL 59 07/05/2022   LDLCALC 91 07/05/2022   TRIG 48 07/05/2022   CHOLHDL 2.7 07/05/2022    Medications Reviewed Today     Reviewed by Vela Prose, RPH (Pharmacist) on 01/21/23 at 1133  Med List Status: <None>   Medication Order Taking? Sig Documenting Provider Last Dose Status Informant  amLODipine (NORVASC) 5 MG tablet [id number] Yes Take 1 tablet (5 mg total) by mouth daily. [name], NP Taking Active            Med Note HUBBY, Aldean Ast Jan 21, 2023 11:07 AM) Taking differently - 1/2 tablet daily  Fremanezumab-vfrm (AJOVY) 225 MG/1.5ML SOAJ [id number] No Inject 225 mg into the skin every 28 (twenty-eight) days.  Patient not taking: Reported on  01/21/2023   Drema Dallas, DO Not Taking Active   gabapentin (NEURONTIN) 300 MG capsule [id number] Yes TAKE 1 CAPSULE 3 TIMES A DAY BY ORAL ROUTE AS DIRECTED FOR 15 DAYS. [provider] Taking Active   MY WAY 1.5 MG tablet [id number] Yes Take by mouth. [provider] Taking Active   ondansetron (ZOFRAN) 4 MG tablet [id number] Yes Take 1 tablet (4 mg total) by mouth every 6  (six) hours. Carroll Sage, PA-C Taking Active   PARoxetine (PAXIL) 10 MG tablet [id number] Yes Take 1 tablet (10 mg total) by mouth daily. [name], NP Taking Active   rizatriptan (MAXALT) 10 MG tablet [id number] Yes Take 1 tablet (10 mg total) by mouth as needed for migraine. May repeat in 2 hours if needed.  Maximum 2 tablets in 24 hours. Drema Dallas, DO Taking Active   tiZANidine (ZANAFLEX) 4 MG tablet [id number] Yes Take 1 tablet (4 mg total) by mouth every 6 (six) hours as needed for muscle spasms. [name], NP Taking Active   Patient not taking:  Discontinued 01/21/23 1133 (Change in therapy)               Assessment/Plan:   Hypertension: - Currently uncontrolled, home BP readings and last office BP reading are above goal <130/80 mm Hg. Concern for non-adherence to medications given last fill date of amlodipine was in 2023. Although patient states that she has been taking amlodipine 5 mg - 1/2 tablet daily. Unable to confirm with medication bottles since she was not at home during our telephone visit.  - Reviewed long term cardiovascular and renal outcomes of uncontrolled blood pressure - Reviewed appropriate blood pressure monitoring technique and reviewed goal blood pressure. Recommended to check home blood pressure and heart rate at least a few times weekly. - Valsartan discontinued from medication list as she has not been taking and reported throat swelling/rash with medication.  - Recommend to increase amlodipine to 5 mg daily. Given the fill history and that she was not at home to confirm her medication bottles with me, unsure if she has actually been taking this. Per chart review, appears she has tolerated amlodipine well in the past. Counseled patient to try taking amlodipine 5 mg daily before bed. Will collaborate with pharmacy to request fill of prescription. - Recommend to start hydrochlorothiazide 25 mg daily. Significantly elevated BP at last 3-4 office  visits and home BP readings above goal favor addition of second antihypertensive. Recommended to take in AM given her concerns with increased urination when taking triamterene/hydrochlorothiazide in the past. Will collaborate with PCP to implement medication change and schedule lab follow up visit for BMET in 2-3 weeks.   Follow Up Plan: Need to schedule lab visit for 2-3 weeks, PharmD on 02/10/22, PCP on 06/13/23  [name], PharmD PGY-1 Pharmacy Resident

## 2023-01-22 ENCOUNTER — Other Ambulatory Visit: Payer: Self-pay | Admitting: Nurse Practitioner

## 2023-01-22 ENCOUNTER — Other Ambulatory Visit: Payer: Self-pay

## 2023-01-22 MED ORDER — HYDROCHLOROTHIAZIDE 25 MG PO TABS
25.0000 mg | ORAL_TABLET | Freq: Every day | ORAL | 1 refills | Status: AC
  Filled 2023-01-22: qty 90, 90d supply, fill #0
  Filled 2023-04-18: qty 90, 90d supply, fill #1

## 2023-01-23 ENCOUNTER — Other Ambulatory Visit: Payer: Self-pay

## 2023-01-23 ENCOUNTER — Telehealth: Payer: Self-pay | Admitting: Neurology

## 2023-01-23 NOTE — Telephone Encounter (Signed)
Pt states that she her Ajovy medication  needs a prior auth done

## 2023-01-24 ENCOUNTER — Other Ambulatory Visit: Payer: Self-pay

## 2023-01-25 ENCOUNTER — Other Ambulatory Visit: Payer: Self-pay

## 2023-02-06 ENCOUNTER — Other Ambulatory Visit: Payer: Medicaid Other

## 2023-02-11 ENCOUNTER — Other Ambulatory Visit: Payer: Self-pay

## 2023-02-11 DIAGNOSIS — I10 Essential (primary) hypertension: Secondary | ICD-10-CM

## 2023-02-11 NOTE — Progress Notes (Signed)
 02/11/2023 Name: Krista [name] MRN: [id number] DOB: 1975-12-07  Chief Complaint  Patient presents with   Hypertension    Krista [name] is a 48 y.o. year old female who presented for a telephone visit.   They were referred to the pharmacist by their PCP for assistance in managing hypertension.    Subjective: Patient reports she has been feeling great lately.  Care Team: Primary Care Provider: Oley Bascom RAMAN, NP ; Next Scheduled Visit: 06/13/2023  Medication Access/Adherence  Current Pharmacy:  [facility] [address], [address] [place] [address] Phone: [phone] Fax: [phone]   Patient reports affordability concerns with their medications: No  Patient reports access/transportation concerns to their pharmacy: No  Patient reports adherence concerns with their medications:  No     Hypertension:  Current medications: amlodipine  5 mg daily, hydrochlorothiazide  25 mg daily Medications tried in the past: valsartan  (throat swelling and rash)  Patient reports BP is much improved since starting hydrochlorothiazide  and she is tolerating the medication well with no issues. She states she showed up for her appointment to have labs drawn on 2/4, but was told she did not need to have any labs.   Patient has an automated, upper arm home BP cuff. Current blood pressure readings readings: 125/85, 118/75   Patient denies hypotensive s/sx including dizziness, lightheadedness.  Patient denies hypertensive symptoms including headache, chest pain, shortness of breath.  Current meal patterns:  - Does not add salt to food - Drinks 2 cans of pepsi per day  Current physical activity: no current exercise, limited by back pain    Objective:  Lab Results  Component Value Date   CREATININE 1.05 (H) 12/13/2022   BUN 10 12/13/2022   NA 142 12/13/2022   K 4.1 12/13/2022   CL 105 12/13/2022   CO2 24 12/13/2022    Lab Results   Component Value Date   CHOL 160 07/05/2022   HDL 59 07/05/2022   LDLCALC 91 07/05/2022   TRIG 48 07/05/2022   CHOLHDL 2.7 07/05/2022    Medications Reviewed Today     Reviewed by Bonnell Izetta SAUNDERS, RPH (Pharmacist) on 02/11/23 at 1429  Med List Status: <None>   Medication Order Taking? Sig Documenting Provider Last Dose Status Informant  amLODipine  (NORVASC ) 5 MG tablet [id number] Yes Take 1 tablet (5 mg total) by mouth daily. Oley Bascom RAMAN, NP Taking Active            Med Note NYLENE, INLOW Feb 11, 2023  2:07 PM)    Fremanezumab -vfrm (AJOVY ) 225 MG/1.5ML SOAJ [id number]  Inject 225 mg into the skin every 28 (twenty-eight) days.  Patient not taking: Reported on 01/21/2023   Skeet Juliene SAUNDERS, DO  Active   gabapentin (NEURONTIN) 300 MG capsule [id number] Yes TAKE 1 CAPSULE 3 TIMES A DAY BY ORAL ROUTE AS DIRECTED FOR 15 DAYS. [provider] Taking Active   hydrochlorothiazide  (HYDRODIURIL ) 25 MG tablet [id number] Yes Take 1 tablet (25 mg total) by mouth daily. Oley Bascom RAMAN, NP Taking Active   MY WAY 1.5 MG tablet [id number]  Take by mouth. [provider]  Active   ondansetron  (ZOFRAN ) 4 MG tablet [id number] Yes Take 1 tablet (4 mg total) by mouth every 6 (six) hours. Waylan Elsie PARAS, PA-C Taking Active   PARoxetine  (PAXIL ) 10 MG tablet [id number] Yes Take 1 tablet (10 mg total) by mouth daily. Oley Bascom RAMAN, NP Taking Active   rizatriptan  (  MAXALT ) 10 MG tablet [id number]  Take 1 tablet (10 mg total) by mouth as needed for migraine. May repeat in 2 hours if needed.  Maximum 2 tablets in 24 hours. Skeet Juliene SAUNDERS, DO  Active   tiZANidine  (ZANAFLEX ) 4 MG tablet [id number] Yes Take 1 tablet (4 mg total) by mouth every 6 (six) hours as needed for muscle spasms. Oley Bascom RAMAN, NP Taking Active               Assessment/Plan:   Hypertension: - Currently controlled based on patient reported home BP readings now at goal with improved medication adherence,  addition of hydrochlorothiazide , and increase in amlodipine . - Reviewed long term cardiovascular and renal outcomes of uncontrolled blood pressure - Reviewed appropriate blood pressure monitoring technique and reviewed goal blood pressure. Recommended to check home blood pressure and heart rate at least a few times weekly - Recommend to continue amlodipine  5 mg daily and hydrochlorothiazide  25 mg daily  - F/u BMP due. Will attempt to reschedule lab visit. Order pended to PCP.   Follow Up Plan: PharmD on 03/13/2023, PCP on 06/13/2023  Izetta Henry, PharmD PGY-1 Pharmacy Resident

## 2023-02-12 ENCOUNTER — Ambulatory Visit: Payer: Medicaid Other | Admitting: Neurology

## 2023-02-12 NOTE — Progress Notes (Signed)
 Pt schedule for one week.

## 2023-02-22 ENCOUNTER — Other Ambulatory Visit: Payer: Self-pay

## 2023-02-22 ENCOUNTER — Other Ambulatory Visit (HOSPITAL_COMMUNITY): Payer: Self-pay

## 2023-02-22 NOTE — Telephone Encounter (Signed)
 Patient advised.

## 2023-02-22 NOTE — Telephone Encounter (Signed)
Medicaid is saying patient has a Editor, commissioning no Primary insurance is coming up when I search.  If patient does not have a Editor, commissioning they will need to call their Medicaid Social Worker to get it removed.  We can not do Prior Authorization until that is removed.

## 2023-02-23 ENCOUNTER — Other Ambulatory Visit: Payer: Self-pay

## 2023-02-23 ENCOUNTER — Other Ambulatory Visit: Payer: Self-pay | Admitting: Nurse Practitioner

## 2023-02-23 DIAGNOSIS — I10 Essential (primary) hypertension: Secondary | ICD-10-CM

## 2023-02-24 LAB — BASIC METABOLIC PANEL
BUN/Creatinine Ratio: 11 (ref 9–23)
BUN: 10 mg/dL (ref 6–24)
CO2: 23 mmol/L (ref 20–29)
Calcium: 9.5 mg/dL (ref 8.7–10.2)
Chloride: 103 mmol/L (ref 96–106)
Creatinine, Ser: 0.9 mg/dL (ref 0.57–1.00)
Glucose: 91 mg/dL (ref 70–99)
Potassium: 3.9 mmol/L (ref 3.5–5.2)
Sodium: 141 mmol/L (ref 134–144)
eGFR: 80 mL/min/{1.73_m2} (ref >59)

## 2023-03-13 ENCOUNTER — Other Ambulatory Visit: Payer: Self-pay

## 2023-03-13 NOTE — Progress Notes (Signed)
   03/13/2023 Name: Krista [name] MRN: [id number] DOB: March 21, 1975  No chief complaint on file.   Krista [name] is a 48 y.o. year old female who presented for a telephone visit.   They were referred to the pharmacist by their PCP for assistance in managing hypertension.    Subjective: Patient reports she has been feeling great lately. No issues with BP recently.   Care Team: Primary Care Provider: [name], NP ; Next Scheduled Visit: 06/13/2023  Medication Access/Adherence  Current Pharmacy:  [facility] [address], [address] [place] [address] Phone: [phone] Fax: [phone]   Patient reports affordability concerns with their medications: No  Patient reports access/transportation concerns to their pharmacy: No  Patient reports adherence concerns with their medications:  No     Hypertension:  Current medications: amlodipine 5 mg daily, hydrochlorothiazide 25 mg daily Medications tried in the past: valsartan (throat swelling and rash)  Patient was able to have labs drawn on 02/23/23.   Patient has an automated, upper arm home BP cuff - pt reports she is resting prior to checking her BP Current blood pressure readings readings: 03/12/23: 118/83 (HR 98), 03/13/23: 123/84 (HR 69)  Patient denies hypotensive s/sx including dizziness, lightheadedness.  Patient denies hypertensive symptoms including headache, chest pain, shortness of breath.  Current meal patterns:  - Does not add salt to food - Drinks 2 cans of pepsi per day  Current physical activity: no current exercise, limited by back pain    Objective:  Lab Results  Component Value Date   CREATININE 0.90 02/23/2023   BUN 10 02/23/2023   NA 141 02/23/2023   K 3.9 02/23/2023   CL 103 02/23/2023   CO2 23 02/23/2023    Lab Results  Component Value Date   CHOL 160 07/05/2022   HDL 59 07/05/2022   LDLCALC 91 07/05/2022   TRIG 48 07/05/2022    CHOLHDL 2.7 07/05/2022    Medications Reviewed Today   Medications were not reviewed in this encounter     Clinical ASCVD: No  The 10-year ASCVD risk score (Arnett DK, et al., 2019) is: 6.4%   Values used to calculate the score:     Age: 66 years     Sex: Female     Is Non-Hispanic African American: Yes     Diabetic: No     Tobacco smoker: No     Systolic Blood Pressure: 169 mmHg     Is BP treated: Yes     HDL Cholesterol: 59 mg/dL     Total Cholesterol: 160 mg/dL    Assessment/Plan:   Hypertension: - Currently controlled based on patient reported home BP readings now at goal with improved medication adherence, addition of hydrochlorothiazide, and increase in amlodipine. Recent BMP WNL after starting hydrochlorothiazide.  - Reviewed long term cardiovascular and renal outcomes of uncontrolled blood pressure - Reviewed appropriate blood pressure monitoring technique and reviewed goal blood pressure. Recommended to check home blood pressure and heart rate at least a few times weekly - Recommend to continue amlodipine 5 mg daily and hydrochlorothiazide 25 mg daily   Follow Up Plan: PharmD telephone on 05/08/23 for home BP follow-up, PCP on 06/13/2023  [name], PharmD PGY-1 Pharmacy Resident

## 2023-04-18 ENCOUNTER — Other Ambulatory Visit: Payer: Self-pay

## 2023-04-20 ENCOUNTER — Other Ambulatory Visit: Payer: Self-pay

## 2023-05-07 ENCOUNTER — Ambulatory Visit: Payer: Medicaid Other | Admitting: Neurology

## 2023-05-08 ENCOUNTER — Other Ambulatory Visit: Payer: Self-pay

## 2023-05-08 NOTE — Progress Notes (Signed)
 05/08/2023 Name: Krista [name] MRN: [id number] DOB: [birth year]  Chief Complaint  Patient presents with   Hypertension    Krista [name] is a 48 y.o. year old female who presented for a telephone visit.   They were referred to the pharmacist by their PCP for assistance in managing hypertension.    Subjective: Patient reports she is doing well today. No complaints.  Care Team: Primary Care Provider: Jerrlyn Morel, NP ; Next Scheduled Visit: 06/13/23  Medication Access/Adherence  Current Pharmacy:  [facility] [address], [address] [place] [address] Phone: [phone] Fax: [phone]   Patient reports affordability concerns with their medications: No  Patient reports access/transportation concerns to their pharmacy: No  Patient reports adherence concerns with their medications:  No  - amlodipine  and hydrochlorothiazide  both last filled 04/20/23 for 90 day supply,    Hypertension:  Current medications: amlodipine  5 mg daily, hydrochlorothiazide  25 mg daily Medications tried in the past: valsartan  (throat swelling and rash)   Patient has a validated, automated, upper arm home BP cuff Current blood pressure readings readings: 123/84, 118/83  Patient denies hypotensive s/sx including dizziness, lightheadedness.  Patient denies hypertensive symptoms including headache, chest pain, shortness of breath  Current meal patterns:  - Does not add salt to food - Drinks 2 cans of pepsi per day  Current physical activity: no current exercise   The 10-year ASCVD risk score (Arnett DK, et al., 2019) is: 1.2%   Values used to calculate the score:     Age: 50 years     Sex: Female     Is Non-Hispanic African American: Yes     Diabetic: No     Tobacco smoker: No     Systolic Blood Pressure: 118 mmHg     Is BP treated: Yes     HDL Cholesterol: 59 mg/dL     Total Cholesterol: 160 mg/dL  Objective:  Today's Vitals    05/08/23 1751  BP: 118/83    Lab Results  Component Value Date   CREATININE 0.90 02/23/2023   BUN 10 02/23/2023   NA 141 02/23/2023   K 3.9 02/23/2023   CL 103 02/23/2023   CO2 23 02/23/2023    Lab Results  Component Value Date   CHOL 160 07/05/2022   HDL 59 07/05/2022   LDLCALC 91 07/05/2022   TRIG 48 07/05/2022   CHOLHDL 2.7 07/05/2022    Medications Reviewed Today     Reviewed by Philmore Bream, RPH (Pharmacist) on 05/08/23 at 1751  Med List Status: <None>   Medication Order Taking? Sig Documenting Provider Last Dose Status Informant  amLODipine  (NORVASC ) 5 MG tablet [id number] Yes Take 1 tablet (5 mg total) by mouth daily. Jerrlyn Morel, NP Taking Active            Med Note DREANNA, MEGGITT Feb 11, 2023  2:07 PM)    Fremanezumab -vfrm (AJOVY ) 225 MG/1.5ML SOAJ [id number]  Inject 225 mg into the skin every 28 (twenty-eight) days.  Patient not taking: Reported on 01/21/2023   Merriam Abbey, DO  Active   gabapentin (NEURONTIN) 300 MG capsule [id number]  TAKE 1 CAPSULE 3 TIMES A DAY BY ORAL ROUTE AS DIRECTED FOR 15 DAYS. [provider]  Active   hydrochlorothiazide  (HYDRODIURIL ) 25 MG tablet [id number] Yes Take 1 tablet (25 mg total) by mouth daily. Jerrlyn Morel, NP Taking Active   MY WAY 1.5 MG tablet [id number]  Take by mouth. [provider]  Active   ondansetron  (ZOFRAN ) 4 MG tablet [id number]  Take 1 tablet (4 mg total) by mouth every 6 (six) hours. Volney Grumbles, PA-C  Active   PARoxetine  (PAXIL ) 10 MG tablet [id number]  Take 1 tablet (10 mg total) by mouth daily. Jerrlyn Morel, NP  Active   rizatriptan  (MAXALT ) 10 MG tablet [id number]  Take 1 tablet (10 mg total) by mouth as needed for migraine. May repeat in 2 hours if needed.  Maximum 2 tablets in 24 hours. Merriam Abbey, DO  Active   tiZANidine  (ZANAFLEX ) 4 MG tablet [id number]  Take 1 tablet (4 mg total) by mouth every 6 (six) hours as needed for muscle spasms. Jerrlyn Morel, NP   Active               Assessment/Plan:   Hypertension: - Currently controlled based on patient reported home BP readings at goal <130/80 mm Hg.  - Reviewed long term cardiovascular and renal outcomes of uncontrolled blood pressure - Reviewed appropriate blood pressure monitoring technique and reviewed goal blood pressure. Recommended to check home blood pressure and heart rate periodically. - Recommend to continue amlodipine  5 mg daily and hydrochlorothiazide  25 mg daily     Follow Up Plan: PCP on 06/13/23 and PharmD as needed  Georga Killings, PharmD PGY-1 Pharmacy Resident
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
## 2023-05-10 ENCOUNTER — Encounter: Payer: Self-pay | Admitting: Neurology

## 2023-05-10 ENCOUNTER — Ambulatory Visit: Payer: Medicaid Other | Admitting: Neurology

## 2023-06-13 ENCOUNTER — Ambulatory Visit: Payer: Self-pay | Admitting: Nurse Practitioner

## 2023-07-16 ENCOUNTER — Emergency Department (HOSPITAL_COMMUNITY)
Admission: EM | Admit: 2023-07-16 | Discharge: 2023-07-16 | Disposition: A | Discharge status: Home / Self Care | Attending: Emergency Medicine | Admitting: Emergency Medicine

## 2023-07-16 ENCOUNTER — Other Ambulatory Visit: Payer: Self-pay

## 2023-07-16 ENCOUNTER — Encounter (HOSPITAL_COMMUNITY): Payer: Self-pay | Admitting: *Deleted

## 2023-07-16 DIAGNOSIS — B9789 Other viral agents as the cause of diseases classified elsewhere: Secondary | ICD-10-CM | POA: Diagnosis not present

## 2023-07-16 DIAGNOSIS — J069 Acute upper respiratory infection, unspecified: Secondary | ICD-10-CM | POA: Insufficient documentation

## 2023-07-16 DIAGNOSIS — J029 Acute pharyngitis, unspecified: Secondary | ICD-10-CM | POA: Insufficient documentation

## 2023-07-16 LAB — RESP PANEL BY RT-PCR (RSV, FLU A&B, COVID)  RVPGX2
Influenza A by PCR: NEGATIVE
Influenza B by PCR: NEGATIVE
Resp Syncytial Virus by PCR: NEGATIVE
SARS Coronavirus 2 by RT PCR: NEGATIVE

## 2023-07-16 LAB — GROUP A STREP BY PCR: Group A Strep by PCR: NOT DETECTED

## 2023-07-16 MED ORDER — ACETAMINOPHEN 500 MG PO TABS
1000.0000 mg | ORAL_TABLET | Freq: Once | ORAL | Status: AC
  Administered 2023-07-16: 1000 mg via ORAL
  Filled 2023-07-16: qty 2

## 2023-07-16 NOTE — ED Triage Notes (Signed)
 Here by POV from home for eye, ear and throat sx. Endorses bilateral eye redness, irritation, drainage/ exudate, bilateral ear pain and sore throat. Onset Friday. No meds PTA. Rates pain 10/10. Alert, NAD, calm, interactive.

## 2023-07-16 NOTE — Discharge Instructions (Signed)
 Return for any problem.   Strep testing today was negative.  COVID and flu testing is negative.  You have a viral illness causing your symptoms.  You should feel better within 3 to 4 days.  Use Tylenol , drink plenty of fluids, and rest as instructed.

## 2023-07-16 NOTE — ED Provider Notes (Signed)
 [place] EMERGENCY DEPARTMENT AT [facility] Provider Note   CSN: [id number] Arrival date & time: 07/16/23  1007     Patient presents with: Eye Drainage, Otalgia, and Sore Throat   Krista [name] is a 48 y.o. female.   48 year old female with prior medical history as detailed below presents for evaluation.  Patient reports that she has had 2 to 3 days of rhinorrhea, congestion, itchy eyes bilaterally, stuffiness, sore throat.  She denies fever.  She works with older patients.  She is requesting a work note so she does not expose them to her illness.  She denies chest pain or shortness of breath.  She denies nausea or vomiting.  She did not take anything this morning for her symptoms.  The history is provided by the patient and medical records.       Prior to Admission medications   Medication Sig Start Date End Date Taking? Authorizing Provider  amLODipine  (NORVASC ) 5 MG tablet Take 1 tablet (5 mg total) by mouth daily. 07/05/22   Oley Bascom RAMAN, NP  Fremanezumab -vfrm (AJOVY ) 225 MG/1.5ML SOAJ Inject 225 mg into the skin every 28 (twenty-eight) days. Patient not taking: Reported on 01/21/2023 11/06/22   Skeet Juliene SAUNDERS, DO  gabapentin (NEURONTIN) 300 MG capsule TAKE 1 CAPSULE 3 TIMES A DAY BY ORAL ROUTE AS DIRECTED FOR 15 DAYS.    [provider]  hydrochlorothiazide  (HYDRODIURIL ) 25 MG tablet Take 1 tablet (25 mg total) by mouth daily. 01/22/23   Nichols, [name] S, NP  MY WAY 1.5 MG tablet Take by mouth. 12/07/22   [provider]  ondansetron  (ZOFRAN ) 4 MG tablet Take 1 tablet (4 mg total) by mouth every 6 (six) hours. 08/03/22   Waylan Elsie PARAS, PA-C  PARoxetine  (PAXIL ) 10 MG tablet Take 1 tablet (10 mg total) by mouth daily. 09/06/22   Oley Bascom RAMAN, NP  rizatriptan  (MAXALT ) 10 MG tablet Take 1 tablet (10 mg total) by mouth as needed for migraine. May repeat in 2 hours if needed.  Maximum 2 tablets in 24 hours. 11/06/22   Skeet Juliene SAUNDERS, DO  tiZANidine   (ZANAFLEX ) 4 MG tablet Take 1 tablet (4 mg total) by mouth every 6 (six) hours as needed for muscle spasms. 12/13/22   Oley Bascom RAMAN, NP    Allergies: Valsartan  and Tea    Review of Systems  All other systems reviewed and are negative.   Updated Vital Signs BP (!) 180/116 (BP Location: Left Arm)   Pulse (!) 57   Temp 98.1 F (36.7 C) (Oral)   Resp 16   Wt 76.7 kg   LMP 11/02/2021   SpO2 100%   BMI 24.96 kg/m   Physical Exam Vitals and nursing note reviewed.  Constitutional:      General: She is not in acute distress.    Appearance: Normal appearance. She is well-developed.  HENT:     Head: Normocephalic and atraumatic.     Right Ear: Ear canal normal.     Left Ear: Ear canal normal.     Ears:     Comments: Mild bilateral ear effusions without evidence of otitis media or externa    Nose: Congestion and rhinorrhea present.  Eyes:     Pupils: Pupils are equal, round, and reactive to light.     Comments: Mild conjunctival irritation and erythema bilaterally.  Cardiovascular:     Rate and Rhythm: Normal rate and regular rhythm.     Heart sounds: Normal heart sounds.  Pulmonary:     Effort: Pulmonary effort is normal. No respiratory distress.     Breath sounds: Normal breath sounds.  Abdominal:     General: There is no distension.     Palpations: Abdomen is soft.     Tenderness: There is no abdominal tenderness.  Musculoskeletal:        General: No deformity. Normal range of motion.     Cervical back: Normal range of motion and neck supple.  Skin:    General: Skin is warm and dry.  Neurological:     General: No focal deficit present.     Mental Status: She is alert and oriented to person, place, and time.     (all labs ordered are listed, but only abnormal results are displayed) Labs Reviewed  RESP PANEL BY RT-PCR (RSV, FLU A&B, COVID)  [id number]  GROUP A STREP BY PCR    EKG: None  Radiology: No results found.   Procedures   Medications Ordered in the  ED  acetaminophen  (TYLENOL ) tablet 1,000 mg (has no administration in time range)                                    Medical Decision Making Risk OTC drugs.    Medical Screen Complete  This patient presented to the ED with complaint of upper respiratory infection.  This complaint involves an extensive number of treatment options. The initial differential diagnosis includes, but is not limited to, viral respiratory infection  This presentation is: Acute and Self-Limited  Patient presents with constellation of symptoms consistent with viral upper respiratory infection.  She is nontoxic in appearance.  Screening labs performed are without acute abnormality.  Importance of close follow-up stressed.  Strict return precautions given and understood.  Problem List / ED Course:  Viral syndrome    Disposition:  After consideration of the diagnostic results and the patients response to treatment, I feel that the patent would benefit from close outpatient follow-up.       Final diagnoses:  Viral pharyngitis  Upper respiratory tract infection, unspecified type    ED Discharge Orders     None          Laurice Maude BROCKS, MD 07/16/23 1209

## 2023-07-17 ENCOUNTER — Telehealth: Payer: Self-pay

## 2023-07-17 NOTE — Transitions of Care (Post Inpatient/ED Visit) (Signed)
   07/17/2023  Name: Krista [name] MRN: [id number] DOB: Nov 22, 1975  Today's TOC FU Call Status:   Patient's Name and Date of Birth confirmed.  Transition Care Management Follow-up Telephone Call Date of Discharge: 07/16/23 Discharge Facility: [facility]) Type of Discharge: Emergency Department How have you been since you were released from the hospital?: Worse Any questions or concerns?: No  Items Reviewed: Did you receive and understand the discharge instructions provided?: Yes Medications obtained,verified, and reconciled?: Yes (Medications Reviewed) Any new allergies since your discharge?: No Dietary orders reviewed?: NA Do you have support at home?: Yes  Medications Reviewed Today: Medications Reviewed Today     Reviewed by Starlene Charlynn BIRCH, CMA (Certified Medical Assistant) on 07/17/23 at 1142  Med List Status: <None>   Medication Order Taking? Sig Documenting Provider Last Dose Status Informant  amLODipine  (NORVASC ) 5 MG tablet [id number] Yes Take 1 tablet (5 mg total) by mouth daily. Oley Bascom RAMAN, NP  Active            Med Note LINDIA, GARMS Feb 11, 2023  2:07 PM)    Fremanezumab -vfrm (AJOVY ) 225 MG/1.5ML SOAJ [id number]  Inject 225 mg into the skin every 28 (twenty-eight) days.  Patient not taking: Reported on 01/21/2023   Skeet Juliene JONELLE, DO  Active   gabapentin (NEURONTIN) 300 MG capsule [id number] Yes TAKE 1 CAPSULE 3 TIMES A DAY BY ORAL ROUTE AS DIRECTED FOR 15 DAYS. [provider]  Active   hydrochlorothiazide  (HYDRODIURIL ) 25 MG tablet [id number] Yes Take 1 tablet (25 mg total) by mouth daily. Oley Bascom RAMAN, NP  Active   MY WAY 1.5 MG tablet [id number] Yes Take by mouth. [provider]  Active   ondansetron  (ZOFRAN ) 4 MG tablet [id number] Yes Take 1 tablet (4 mg total) by mouth every 6 (six) hours. Waylan Elsie PARAS, PA-C  Active   PARoxetine  (PAXIL ) 10 MG tablet [id number] Yes Take 1 tablet (10 mg total) by mouth daily. Oley Bascom RAMAN, NP  Active   rizatriptan  (MAXALT ) 10 MG tablet [id number] Yes Take 1 tablet (10 mg total) by mouth as needed for migraine. May repeat in 2 hours if needed.  Maximum 2 tablets in 24 hours. Skeet Juliene JONELLE, DO  Active   tiZANidine  (ZANAFLEX ) 4 MG tablet [id number] Yes Take 1 tablet (4 mg total) by mouth every 6 (six) hours as needed for muscle spasms. Oley Bascom RAMAN, NP  Active             Home Care and Equipment/Supplies: Were Home Health Services Ordered?: NA Any new equipment or medical supplies ordered?: NA  Functional Questionnaire: Do you need assistance with bathing/showering or dressing?: No Do you need assistance with meal preparation?: No Do you need assistance with eating?: No Do you have difficulty maintaining continence: No Do you need assistance with getting out of bed/getting out of a chair/moving?: No Do you have difficulty managing or taking your medications?: No  Follow up appointments reviewed: PCP Follow-up appointment confirmed?: Yes Date of PCP follow-up appointment?: 07/20/23 Specialist Hospital Follow-up appointment confirmed?: NA Do you need transportation to your follow-up appointment?: No Do you understand care options if your condition(s) worsen?: Yes-patient verbalized understanding    SIGNATURE Memorie Yokoyama, RMA

## 2023-07-20 ENCOUNTER — Ambulatory Visit: Payer: Self-pay | Admitting: Nurse Practitioner

## 2023-07-27 ENCOUNTER — Other Ambulatory Visit: Payer: Self-pay

## 2023-07-27 ENCOUNTER — Encounter: Payer: Self-pay | Admitting: Nurse Practitioner

## 2023-07-27 ENCOUNTER — Ambulatory Visit: Admitting: Nurse Practitioner

## 2023-07-27 VITALS — BP 139/100 | HR 70 | Temp 98.0°F | Wt 178.2 lb

## 2023-07-27 DIAGNOSIS — Z13220 Encounter for screening for lipoid disorders: Secondary | ICD-10-CM | POA: Diagnosis not present

## 2023-07-27 DIAGNOSIS — G8929 Other chronic pain: Secondary | ICD-10-CM

## 2023-07-27 DIAGNOSIS — I10 Essential (primary) hypertension: Secondary | ICD-10-CM | POA: Diagnosis not present

## 2023-07-27 DIAGNOSIS — M545 Low back pain, unspecified: Secondary | ICD-10-CM

## 2023-07-27 MED ORDER — TIZANIDINE HCL 4 MG PO TABS
4.0000 mg | ORAL_TABLET | Freq: Four times a day (QID) | ORAL | 0 refills | Status: AC | PRN
  Filled 2023-07-27: qty 30, 8d supply, fill #0

## 2023-07-27 NOTE — Progress Notes (Signed)
 Subjective   Patient ID: Krista [name], female    DOB: June 06, 1975, 47 y.o.   MRN: [id number]  Chief Complaint  Patient presents with   Hypertension    Referring provider: Oley Bascom RAMAN, NP  Krista [name] is a 48 y.o. female with Past Medical History: No date: Hypertension No date: Migraine   HPI  Patient presents today for follow-up visit.  Overall she has been doing well since her last visit here.  She does need refill on tizanidine . Denies f/c/s, n/v/d, hemoptysis, PND, leg swelling. Denies chest pain or edema.     Allergies  Allergen Reactions   Valsartan  Swelling    Throat swelling and rash on leg, no SOB   Tea Swelling    Immunization History  Administered Date(s) Administered   Influenza, Seasonal, Injecte, Preservative Fre 09/14/2022   Tdap 12/13/2022    Tobacco History: Social History   Tobacco Use  Smoking Status Former   Types: Cigarettes  Smokeless Tobacco Never   Counseling given: Not Answered   Outpatient Encounter Medications as of 07/27/2023  Medication Sig   amLODipine  (NORVASC ) 5 MG tablet Take 1 tablet (5 mg total) by mouth daily.   gabapentin (NEURONTIN) 300 MG capsule TAKE 1 CAPSULE 3 TIMES A DAY BY ORAL ROUTE AS DIRECTED FOR 15 DAYS.   hydrochlorothiazide  (HYDRODIURIL ) 25 MG tablet Take 1 tablet (25 mg total) by mouth daily.   MY WAY 1.5 MG tablet Take by mouth.   ondansetron  (ZOFRAN ) 4 MG tablet Take 1 tablet (4 mg total) by mouth every 6 (six) hours.   PARoxetine  (PAXIL ) 10 MG tablet Take 1 tablet (10 mg total) by mouth daily.   rizatriptan  (MAXALT ) 10 MG tablet Take 1 tablet (10 mg total) by mouth as needed for migraine. May repeat in 2 hours if needed.  Maximum 2 tablets in 24 hours.   [DISCONTINUED] tiZANidine  (ZANAFLEX ) 4 MG tablet Take 1 tablet (4 mg total) by mouth every 6 (six) hours as needed for muscle spasms.   Fremanezumab -vfrm (AJOVY ) 225 MG/1.5ML SOAJ Inject 225 mg into the skin every 28 (twenty-eight) days. (Patient not  taking: Reported on 01/21/2023)   tiZANidine  (ZANAFLEX ) 4 MG tablet Take 1 tablet (4 mg total) by mouth every 6 (six) hours as needed for muscle spasms.   No facility-administered encounter medications on file as of 07/27/2023.    Review of Systems  Review of Systems  Constitutional: Negative.   HENT: Negative.    Cardiovascular: Negative.   Gastrointestinal: Negative.   Allergic/Immunologic: Negative.   Neurological: Negative.   Psychiatric/Behavioral: Negative.       Objective:   BP (!) 139/100   Pulse 70   Temp 98 F (36.7 C) (Oral)   Wt 178 lb 3.2 oz (80.8 kg)   LMP 11/02/2021   SpO2 99%   BMI 26.32 kg/m   Wt Readings from Last 5 Encounters:  07/27/23 178 lb 3.2 oz (80.8 kg)  07/16/23 169 lb (76.7 kg)  12/13/22 169 lb 12.8 oz (77 kg)  11/06/22 172 lb (78 kg)  09/14/22 162 lb 9.6 oz (73.8 kg)     Physical Exam Vitals and nursing note reviewed.  Constitutional:      General: She is not in acute distress.    Appearance: She is well-developed.  Cardiovascular:     Rate and Rhythm: Normal rate and regular rhythm.  Pulmonary:     Effort: Pulmonary effort is normal.     Breath sounds: Normal breath sounds.  Neurological:  Mental Status: She is alert and oriented to person, place, and time.       Assessment & Plan:   Lipid screening -     Lipid panel  Essential hypertension -     CBC -     Comprehensive metabolic panel with GFR  Chronic midline low back pain, unspecified whether sciatica present -     tiZANidine  HCl; Take 1 tablet (4 mg total) by mouth every 6 (six) hours as needed for muscle spasms.  Dispense: 30 tablet; Refill: 0     Return in about 6 months (around 01/27/2024).   Bascom GORMAN Borer, NP 07/27/2023

## 2023-07-28 LAB — COMPREHENSIVE METABOLIC PANEL WITH GFR
ALT: 16 IU/L (ref 0–32)
AST: 23 IU/L (ref 0–40)
Albumin: 4.6 g/dL (ref 3.9–4.9)
Alkaline Phosphatase: 75 IU/L (ref 44–121)
BUN/Creatinine Ratio: 10 (ref 9–23)
BUN: 11 mg/dL (ref 6–24)
Bilirubin Total: 0.4 mg/dL (ref 0.0–1.2)
CO2: 25 mmol/L (ref 20–29)
Calcium: 10.1 mg/dL (ref 8.7–10.2)
Chloride: 102 mmol/L (ref 96–106)
Creatinine, Ser: 1.05 mg/dL — ABNORMAL HIGH (ref 0.57–1.00)
Globulin, Total: 3.5 g/dL (ref 1.5–4.5)
Glucose: 73 mg/dL (ref 70–99)
Potassium: 3.8 mmol/L (ref 3.5–5.2)
Sodium: 141 mmol/L (ref 134–144)
Total Protein: 8.1 g/dL (ref 6.0–8.5)
eGFR: 66 mL/min/1.73 (ref >59)

## 2023-07-28 LAB — CBC
Hematocrit: 43.5 % (ref 34.0–46.6)
Hemoglobin: 14.3 g/dL (ref 11.1–15.9)
MCH: 31.6 pg (ref 26.6–33.0)
MCHC: 32.9 g/dL (ref 31.5–35.7)
MCV: 96 fL (ref 79–97)
Platelets: 313 x10E3/uL (ref 150–450)
RBC: 4.53 x10E6/uL (ref 3.77–5.28)
RDW: 11.6 % — ABNORMAL LOW (ref 11.7–15.4)
WBC: 6.1 x10E3/uL (ref 3.4–10.8)

## 2023-07-28 LAB — LIPID PANEL
Chol/HDL Ratio: 3 ratio (ref 0.0–4.4)
Cholesterol, Total: 174 mg/dL (ref 100–199)
HDL: 58 mg/dL
LDL Chol Calc (NIH): 104 mg/dL — ABNORMAL HIGH (ref 0–99)
Triglycerides: 59 mg/dL (ref 0–149)
VLDL Cholesterol Cal: 12 mg/dL (ref 5–40)

## 2023-07-29 ENCOUNTER — Ambulatory Visit: Payer: Self-pay | Admitting: Nurse Practitioner

## 2023-07-30 ENCOUNTER — Other Ambulatory Visit: Payer: Self-pay

## 2023-08-01 ENCOUNTER — Ambulatory Visit: Payer: Self-pay

## 2023-08-01 NOTE — Telephone Encounter (Signed)
 FYI Only or Action Required?: FYI only for provider.  Patient was last seen in primary care on 07/27/2023 by Oley Bascom RAMAN, NP.  Called Nurse Triage reporting Vaginal Discharge and Flank Pain.  Symptoms began yesterday.  Interventions attempted: Nothing.  Symptoms are: gradually worsening.  Triage Disposition: See Physician Within 24 Hours- patient to [facility]  Patient/caregiver understands and will follow disposition?: Yes  Message from [facility] sent at 08/01/2023 11:34 AM EDT  Summary: Discharge + side pain   Pt is experiencing discharge and side pain, just started yesterday  Best contact: [phone]         Reason for Disposition  Urinating more frequently than usual (i.e., frequency) OR new-onset of the feeling of an urgent need to urinate (i.e., urgency)  Answer Assessment - Initial Assessment Questions 1. SYMPTOM: What's the main symptom you're concerned about? (e.g., frequency, incontinence)     Increased frequency, flank pain, burning, vaginal discharge 2. ONSET: When did the  symptoms   start?     One day ago 3. PAIN: Is there any pain? If Yes, ask: How bad is it? (Scale: 1-10; mild, moderate, severe)     Pain 9/10 with urination 4. CAUSE: What do you think is causing the symptoms?     unknown 5. OTHER SYMPTOMS: Do you have any other symptoms? (e.g., blood in urine, fever, flank pain, pain with urination)     Denies bleeding or fever 6. PREGNANCY: Is there any chance you are pregnant? When was your last menstrual period?     N/A  Protocols used: Urinary Symptoms-A-AH

## 2023-08-02 NOTE — Telephone Encounter (Signed)
 Pt will be seen tomorrow at 10:20 am

## 2023-08-03 ENCOUNTER — Other Ambulatory Visit: Payer: Self-pay

## 2023-08-03 ENCOUNTER — Encounter: Payer: Self-pay | Admitting: Nurse Practitioner

## 2023-08-03 ENCOUNTER — Ambulatory Visit: Payer: Self-pay | Admitting: Nurse Practitioner

## 2023-08-03 VITALS — BP 129/92 | HR 66 | Temp 98.8°F | Wt 180.4 lb

## 2023-08-03 DIAGNOSIS — N898 Other specified noninflammatory disorders of vagina: Secondary | ICD-10-CM

## 2023-08-03 DIAGNOSIS — R21 Rash and other nonspecific skin eruption: Secondary | ICD-10-CM

## 2023-08-03 DIAGNOSIS — R399 Unspecified symptoms and signs involving the genitourinary system: Secondary | ICD-10-CM

## 2023-08-03 LAB — POCT URINALYSIS DIP (CLINITEK)
Bilirubin, UA: NEGATIVE
Glucose, UA: NEGATIVE mg/dL
Ketones, POC UA: NEGATIVE mg/dL
Nitrite, UA: NEGATIVE
POC PROTEIN,UA: NEGATIVE
Spec Grav, UA: 1.015 (ref 1.010–1.025)
Urobilinogen, UA: 0.2 U/dL
pH, UA: 5.5 (ref 5.0–8.0)

## 2023-08-03 MED ORDER — SULFAMETHOXAZOLE-TRIMETHOPRIM 800-160 MG PO TABS
1.0000 | ORAL_TABLET | Freq: Two times a day (BID) | ORAL | 0 refills | Status: AC
Start: 2023-08-03 — End: 2023-08-10
  Filled 2023-08-03: qty 14, 7d supply, fill #0

## 2023-08-03 MED ORDER — PREDNISONE 20 MG PO TABS
20.0000 mg | ORAL_TABLET | Freq: Every day | ORAL | 0 refills | Status: AC
Start: 2023-08-03 — End: 2023-08-08
  Filled 2023-08-03: qty 5, 5d supply, fill #0

## 2023-08-03 NOTE — Progress Notes (Signed)
 Subjective   Patient ID: Krista [name], female    DOB: November 03, 1975, 47 y.o.   MRN: [id number]  Chief Complaint  Patient presents with   Urinary Tract Infection    Painful urination    Rash    Rash on arm after patient went to the dentist     Referring provider: Oley Bascom RAMAN, NP  Krista [name] is a 48 y.o. female with Past Medical History: No date: Hypertension No date: Migraine   HPI  Patient presents today for an acute visit.  She has a rash to her right arm.  She states this happened after a dental procedure.  She was concerned that she had a reaction to the Novocain.  She did contact the dentist office.  Patient is also having pain with urination and vaginal discharge.  We will check UA and new swab. Denies f/c/s, n/v/d, hemoptysis, PND, leg swelling Denies chest pain or edema.       Allergies  Allergen Reactions   Valsartan  Swelling    Throat swelling and rash on leg, no SOB   Tea Swelling    Immunization History  Administered Date(s) Administered   Influenza, Seasonal, Injecte, Preservative Fre 09/14/2022   Tdap 12/13/2022    Tobacco History: Social History   Tobacco Use  Smoking Status Former   Types: Cigarettes  Smokeless Tobacco Never   Counseling given: Not Answered   Outpatient Encounter Medications as of 08/03/2023  Medication Sig   amLODipine  (NORVASC ) 5 MG tablet Take 1 tablet (5 mg total) by mouth daily.   gabapentin (NEURONTIN) 300 MG capsule TAKE 1 CAPSULE 3 TIMES A DAY BY ORAL ROUTE AS DIRECTED FOR 15 DAYS.   hydrochlorothiazide  (HYDRODIURIL ) 25 MG tablet Take 1 tablet (25 mg total) by mouth daily.   MY WAY 1.5 MG tablet Take by mouth.   ondansetron  (ZOFRAN ) 4 MG tablet Take 1 tablet (4 mg total) by mouth every 6 (six) hours.   PARoxetine  (PAXIL ) 10 MG tablet Take 1 tablet (10 mg total) by mouth daily.   predniSONE  (DELTASONE ) 20 MG tablet Take 1 tablet (20 mg total) by mouth daily with breakfast for 5 days.   rizatriptan  (MAXALT ) 10 MG  tablet Take 1 tablet (10 mg total) by mouth as needed for migraine. May repeat in 2 hours if needed.  Maximum 2 tablets in 24 hours.   sulfamethoxazole -trimethoprim  (BACTRIM  DS) 800-160 MG tablet Take 1 tablet by mouth 2 (two) times daily for 7 days.   tiZANidine  (ZANAFLEX ) 4 MG tablet Take 1 tablet (4 mg total) by mouth every 6 (six) hours as needed for muscle spasms.   Fremanezumab -vfrm (AJOVY ) 225 MG/1.5ML SOAJ Inject 225 mg into the skin every 28 (twenty-eight) days. (Patient not taking: Reported on 01/21/2023)   No facility-administered encounter medications on file as of 08/03/2023.    Review of Systems  Review of Systems  Constitutional: Negative.   HENT: Negative.    Cardiovascular: Negative.   Gastrointestinal: Negative.   Allergic/Immunologic: Negative.   Neurological: Negative.   Psychiatric/Behavioral: Negative.       Objective:   BP (!) 129/92   Pulse 66   Temp 98.8 F (37.1 C) (Oral)   Wt 180 lb 6.4 oz (81.8 kg)   LMP 11/02/2021   SpO2 100%   BMI 26.64 kg/m   Wt Readings from Last 5 Encounters:  08/03/23 180 lb 6.4 oz (81.8 kg)  07/27/23 178 lb 3.2 oz (80.8 kg)  07/16/23 169 lb (76.7 kg)  12/13/22 169 lb  12.8 oz (77 kg)  11/06/22 172 lb (78 kg)     Physical Exam Vitals and nursing note reviewed.  Constitutional:      General: She is not in acute distress.    Appearance: She is well-developed.  Cardiovascular:     Rate and Rhythm: Normal rate and regular rhythm.  Pulmonary:     Effort: Pulmonary effort is normal.     Breath sounds: Normal breath sounds.  Neurological:     Mental Status: She is alert and oriented to person, place, and time.       Assessment & Plan:   UTI symptoms -     POCT URINALYSIS DIP (CLINITEK) -     CBC -     Comprehensive metabolic panel with GFR -     Urine Culture  Vaginal discharge -     NuSwab Vaginitis Plus (VG+)  Rash -     predniSONE ; Take 1 tablet (20 mg total) by mouth daily with breakfast for 5 days.   Dispense: 5 tablet; Refill: 0  Other orders -     Sulfamethoxazole -Trimethoprim ; Take 1 tablet by mouth 2 (two) times daily for 7 days.  Dispense: 14 tablet; Refill: 0     Return if symptoms worsen or fail to improve.   Bascom GORMAN Borer, NP 08/03/2023

## 2023-08-04 LAB — COMPREHENSIVE METABOLIC PANEL WITH GFR
ALT: 18 IU/L (ref 0–32)
AST: 23 IU/L (ref 0–40)
Albumin: 4.4 g/dL (ref 3.9–4.9)
Alkaline Phosphatase: 73 IU/L (ref 44–121)
BUN/Creatinine Ratio: 10 (ref 9–23)
BUN: 9 mg/dL (ref 6–24)
Bilirubin Total: 0.5 mg/dL (ref 0.0–1.2)
CO2: 23 mmol/L (ref 20–29)
Calcium: 9.7 mg/dL (ref 8.7–10.2)
Chloride: 103 mmol/L (ref 96–106)
Creatinine, Ser: 0.86 mg/dL (ref 0.57–1.00)
Globulin, Total: 3 g/dL (ref 1.5–4.5)
Glucose: 74 mg/dL (ref 70–99)
Potassium: 4.1 mmol/L (ref 3.5–5.2)
Sodium: 140 mmol/L (ref 134–144)
Total Protein: 7.4 g/dL (ref 6.0–8.5)
eGFR: 84 mL/min/1.73 (ref >59)

## 2023-08-04 LAB — CBC
Hematocrit: 38.6 % (ref 34.0–46.6)
Hemoglobin: 12.9 g/dL (ref 11.1–15.9)
MCH: 32.1 pg (ref 26.6–33.0)
MCHC: 33.4 g/dL (ref 31.5–35.7)
MCV: 96 fL (ref 79–97)
Platelets: 264 x10E3/uL (ref 150–450)
RBC: 4.02 x10E6/uL (ref 3.77–5.28)
RDW: 11.5 % — ABNORMAL LOW (ref 11.7–15.4)
WBC: 4.5 x10E3/uL (ref 3.4–10.8)

## 2023-08-05 LAB — URINE CULTURE

## 2023-08-06 ENCOUNTER — Other Ambulatory Visit: Payer: Self-pay

## 2023-08-06 ENCOUNTER — Ambulatory Visit: Payer: Self-pay | Admitting: Nurse Practitioner

## 2023-08-06 MED ORDER — AMOXICILLIN-POT CLAVULANATE 875-125 MG PO TABS
1.0000 | ORAL_TABLET | Freq: Two times a day (BID) | ORAL | 0 refills | Status: DC
  Filled 2023-08-06: qty 20, 10d supply, fill #0

## 2023-08-07 LAB — NUSWAB VAGINITIS PLUS (VG+)
Candida albicans, NAA: NEGATIVE
Candida glabrata, NAA: NEGATIVE
Trich vag by NAA: POSITIVE — AB

## 2023-08-09 ENCOUNTER — Other Ambulatory Visit: Payer: Self-pay

## 2023-08-09 MED ORDER — METRONIDAZOLE 500 MG PO TABS
500.0000 mg | ORAL_TABLET | Freq: Two times a day (BID) | ORAL | 0 refills | Status: AC
  Filled 2023-08-09: qty 14, 7d supply, fill #0

## 2023-09-11 ENCOUNTER — Emergency Department (HOSPITAL_COMMUNITY)
Admission: EM | Admit: 2023-09-11 | Discharge: 2023-09-11 | Disposition: A | Discharge status: Home / Self Care | Attending: Emergency Medicine | Admitting: Emergency Medicine

## 2023-09-11 ENCOUNTER — Emergency Department (HOSPITAL_COMMUNITY)

## 2023-09-11 ENCOUNTER — Encounter (HOSPITAL_COMMUNITY): Payer: Self-pay

## 2023-09-11 ENCOUNTER — Other Ambulatory Visit: Payer: Self-pay

## 2023-09-11 DIAGNOSIS — M5442 Lumbago with sciatica, left side: Secondary | ICD-10-CM | POA: Insufficient documentation

## 2023-09-11 DIAGNOSIS — M545 Low back pain, unspecified: Secondary | ICD-10-CM | POA: Diagnosis present

## 2023-09-11 LAB — URINALYSIS, ROUTINE W REFLEX MICROSCOPIC
Bacteria, UA: NONE SEEN
Bilirubin Urine: NEGATIVE
Glucose, UA: NEGATIVE mg/dL
Ketones, ur: NEGATIVE mg/dL
Nitrite: NEGATIVE
Protein, ur: NEGATIVE mg/dL
Specific Gravity, Urine: 1.017 (ref 1.005–1.030)
pH: 7 (ref 5.0–8.0)

## 2023-09-11 LAB — PREGNANCY, URINE: Preg Test, Ur: NEGATIVE

## 2023-09-11 MED ORDER — IBUPROFEN 800 MG PO TABS
800.0000 mg | ORAL_TABLET | Freq: Once | ORAL | Status: AC
  Administered 2023-09-11: 800 mg via ORAL
  Filled 2023-09-11: qty 1

## 2023-09-11 MED ORDER — LIDOCAINE 5 % EX PTCH
1.0000 | MEDICATED_PATCH | CUTANEOUS | Status: DC
  Administered 2023-09-11: 1 via TRANSDERMAL
  Filled 2023-09-11: qty 1

## 2023-09-11 MED ORDER — KETOROLAC TROMETHAMINE 15 MG/ML IJ SOLN
15.0000 mg | Freq: Once | INTRAMUSCULAR | Status: AC
  Administered 2023-09-11: 15 mg via INTRAMUSCULAR
  Filled 2023-09-11: qty 1

## 2023-09-11 MED ORDER — PREDNISONE 20 MG PO TABS
40.0000 mg | ORAL_TABLET | Freq: Every day | ORAL | 0 refills | Status: DC
  Filled 2023-09-11 – 2023-09-12 (×2): qty 6, 3d supply, fill #0

## 2023-09-11 MED ORDER — LIDOCAINE 5 % EX PTCH
1.0000 | MEDICATED_PATCH | CUTANEOUS | 0 refills | Status: DC
  Filled 2023-09-11 – 2023-09-12 (×2): qty 30, 30d supply, fill #0

## 2023-09-11 NOTE — ED Triage Notes (Signed)
 Patient has had lower back pain for 2 weeks. Now pain travels down her left leg. Painful to ambulate or sit.

## 2023-09-11 NOTE — ED Provider Notes (Signed)
 [place] EMERGENCY DEPARTMENT AT [facility] Provider Note   CSN: [id number] Arrival date & time: 09/11/23  1612     Patient presents with: Back Pain   Krista [name] is a 48 y.o. female patient who presents to the emergency department today for further evaluation of midline lower back pain with radiation down the left leg.  Symptoms been persistent for the last 2 weeks.  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  She does not recall particular incident where this started.  She has been taking Tylenol  with little relief.  Denies any weakness or numbness to her lower extremities.  No bowel or bladder incontinence.  No fever or chills.    Back Pain      Prior to Admission medications   Medication Sig Start Date End Date Taking? Authorizing Provider  lidocaine  (LIDODERM ) 5 % Place 1 patch onto the skin daily. Remove & Discard patch within 12 hours or as directed by MD 09/11/23  Yes Theotis, Amerika Nourse M, PA-C  predniSONE  (DELTASONE ) 20 MG tablet Take 2 tablets (40 mg total) by mouth daily. 09/11/23  Yes Theotis, Janely Gullickson M, PA-C  amLODipine  (NORVASC ) 5 MG tablet Take 1 tablet (5 mg total) by mouth daily. 07/05/22   Oley Bascom RAMAN, NP  amoxicillin -clavulanate (AUGMENTIN ) 875-125 MG tablet Take 1 tablet by mouth 2 (two) times daily. 08/06/23   Oley Bascom RAMAN, NP  Fremanezumab -vfrm (AJOVY ) 225 MG/1.5ML SOAJ Inject 225 mg into the skin every 28 (twenty-eight) days. Patient not taking: Reported on 01/21/2023 11/06/22   Skeet Juliene SAUNDERS, DO  gabapentin (NEURONTIN) 300 MG capsule TAKE 1 CAPSULE 3 TIMES A DAY BY ORAL ROUTE AS DIRECTED FOR 15 DAYS.    [provider]  hydrochlorothiazide  (HYDRODIURIL ) 25 MG tablet Take 1 tablet (25 mg total) by mouth daily. 01/22/23   Nichols, [name] S, NP  MY WAY 1.5 MG tablet Take by mouth. 12/07/22   [provider]  ondansetron  (ZOFRAN ) 4 MG tablet Take 1 tablet (4 mg  total) by mouth every 6 (six) hours. 08/03/22   Waylan Elsie PARAS, PA-C  PARoxetine  (PAXIL ) 10 MG tablet Take 1 tablet (10 mg total) by mouth daily. 09/06/22   Oley Bascom RAMAN, NP  rizatriptan  (MAXALT ) 10 MG tablet Take 1 tablet (10 mg total) by mouth as needed for migraine. May repeat in 2 hours if needed.  Maximum 2 tablets in 24 hours. 11/06/22   Skeet Juliene SAUNDERS, DO  tiZANidine  (ZANAFLEX ) 4 MG tablet Take 1 tablet (4 mg total) by mouth every 6 (six) hours as needed for muscle spasms. 07/27/23   Oley Bascom RAMAN, NP    Allergies: Valsartan  and Tea    Review of Systems  Musculoskeletal:  Positive for back pain.  All other systems reviewed and are negative.   Updated Vital Signs BP (!) 158/113   Pulse 61   Temp 98 F (36.7 C) (Oral)   Resp 19   Ht 5' 9 (1.753 m)   Wt 81.6 kg   LMP 11/02/2021   SpO2 100%   BMI 26.58 kg/m   Physical Exam Vitals and nursing note reviewed.  Constitutional:      Appearance: Normal appearance.  HENT:     Head: Normocephalic and atraumatic.  Eyes:     General:        Right eye: No discharge.        Left eye: No  discharge.     Conjunctiva/sclera: Conjunctivae normal.  Pulmonary:     Effort: Pulmonary effort is normal.  Musculoskeletal:     Comments: Midline lower back tenderness.  5/5 strength to the lower extremities. Normal sensation to the lower extremities.   Skin:    General: Skin is warm and dry.     Findings: No rash.  Neurological:     General: No focal deficit present.     Mental Status: She is alert.  Psychiatric:        Mood and Affect: Mood normal.        Behavior: Behavior normal.     (all labs ordered are listed, but only abnormal results are displayed) Labs Reviewed  URINALYSIS, ROUTINE W REFLEX MICROSCOPIC - Abnormal; Notable for the following components:      Result Value   Hgb urine dipstick SMALL (*)    Leukocytes,Ua TRACE (*)    All other components within normal limits  PREGNANCY, URINE     EKG: None  Radiology: DG Lumbar Spine Complete Result Date: 09/11/2023 CLINICAL DATA:  Midline low back pain.  No known injury. EXAM: LUMBAR SPINE - COMPLETE 4+ VIEW COMPARISON:  None Available. FINDINGS: Disc space narrowing at L5-S1. Normal alignment. Mild degenerative facet disease at L4-5 and L5-S1. No fracture. IMPRESSION: Degenerative disc and facet disease in the lower lumbar spine. No acute bony abnormality. Electronically Signed   By: Franky Crease M.D.   On: 09/11/2023 19:42     Procedures   Medications Ordered in the ED  lidocaine  (LIDODERM ) 5 % 1 patch (1 patch Transdermal Patch Applied 09/11/23 1804)  ibuprofen  (ADVIL ) tablet 800 mg (800 mg Oral Given 09/11/23 1634)  ketorolac  (TORADOL ) 15 MG/ML injection 15 mg (15 mg Intramuscular Given 09/11/23 1803)     Medical Decision Making Krista [name] is a 48 y.o. female who presents to the emergency apartment today for further evaluation of lower back pain. This patient presents with back pain most consistent with sciatica. Differential diagnoses includes lumbago versus musculoskeletal spasm / strain versus sciatica. Less likely sciatica as straight leg raise test was negative. No back pain red flags on history or physical. Presentation not consistent with malignancy (lack of history of malignancy, lack of B symptoms), fracture (no trauma, no bony tenderness to palpation), cauda equina (no bowel or urinary incontinence/retention, no saddle anesthesia, no distal weakness), AAA, viscus perforation, osteomyelitis or epidural abscess (no IVDU, vertebral tenderness), renal colic, pyelonephritis (afebrile, no CVAT, no urinary symptoms). Imaging revealed some degenerative disc disease and some disc space narrowing.  Will treat with steroids and NSAIDs.  Patient given Toradol  lidocaine  patches which showed improvement.  Strict turn precaution were discussed.  She is safe for discharge.  Amount and/or Complexity of Data Reviewed Labs:  ordered. Radiology: ordered.  Risk Prescription drug management.     Final diagnoses:  Acute midline low back pain with left-sided sciatica    ED Discharge Orders          Ordered    predniSONE  (DELTASONE ) 20 MG tablet  Daily        09/11/23 2043    lidocaine  (LIDODERM ) 5 %  Every 24 hours        09/11/23 2043               Theotis Peers Frontin, PA-C 09/11/23 2253    Lenor Hollering, MD 09/12/23 1409

## 2023-09-11 NOTE — ED Provider Triage Note (Signed)
 Emergency Medicine Provider Triage Evaluation Note  Krista [name] , a 48 y.o. female  was evaluated in triage.  Pt complains of low back pain radiating into left leg for about two weeks. History of lumbar discectomy in June of 2022. Patient has had intermittent pain since the surgery. No recent injury to back. Denies heavy lifting. No urinary or fecal incontinence. She reports pain with ambulation and sitting   Review of Systems  Positive: Low back pain Negative: Urinary/fecal incontinence  Physical Exam  BP (!) 158/113   Pulse 61   Temp 98 F (36.7 C) (Oral)   Resp 19   Ht 5' 9 (1.753 m)   Wt 81.6 kg   LMP 11/02/2021   SpO2 100%   BMI 26.58 kg/m  Gen:   Awake, no distress   Resp:  Normal effort  MSK:   Moves extremities without difficulty. No strength deficit Other:    Medical Decision Making  Medically screening exam initiated at 4:25 PM.  Appropriate orders placed.  Krista [name] was informed that the remainder of the evaluation will be completed by another provider, this initial triage assessment does not replace that evaluation, and the importance of remaining in the ED until their evaluation is complete.     Claudene Lenis, NP 09/11/23 [phone]

## 2023-09-11 NOTE — Discharge Instructions (Signed)
 As we discussed, you do have some degenerative disc disease and some disc space narrowing in your lower lumbar spine.  I suspect you have what is called sciatica.  This is when the nerve gets irritated.  I am going to prescribe you 3 days worth of steroids and pain control with lidocaine  patches.  I would like for you to continue taking 600 mg of ibuprofen  every 6 hours as needed for pain.  You can alternate with Tylenol  at the 3-hour mark.  Please return to the emergency room if any worsening symptoms.  Otherwise, I would like for you to follow-up with your primary care doctor.

## 2023-09-12 ENCOUNTER — Other Ambulatory Visit: Payer: Self-pay

## 2023-09-13 ENCOUNTER — Telehealth: Payer: Self-pay

## 2023-09-13 NOTE — Transitions of Care (Post Inpatient/ED Visit) (Cosign Needed)
 09/13/2023  Name: Krista [name] MRN: [id number] DOB: May 03, 1975  Today's TOC FU Call Status: Today's TOC FU Call Status:: Successful TOC FU Call Completed TOC FU Call Complete Date: 09/13/23 Patient's Name and Date of Birth confirmed.  Transition Care Management Follow-up Telephone Call Date of Discharge: 09/11/23 Discharge Facility: [facility]) Type of Discharge: Emergency Department Reason for ED Visit: Orthopedic Conditions How have you been since you were released from the hospital?: Same Any questions or concerns?: No  Items Reviewed: Did you receive and understand the discharge instructions provided?: Yes Medications obtained,verified, and reconciled?: Yes (Medications Reviewed) Any new allergies since your discharge?: No Dietary orders reviewed?: NA Do you have support at home?: Yes People in Home [RPT]: spouse  Medications Reviewed Today: Medications Reviewed Today     Reviewed by Victory Iha, RMA (Registered Medical Assistant) on 09/13/23 at 1219  Med List Status: <None>   Medication Order Taking? Sig Documenting Provider Last Dose Status Informant  amLODipine  (NORVASC ) 5 MG tablet [id number] Yes Take 1 tablet (5 mg total) by mouth daily. Oley Bascom RAMAN, NP  Active            Med Note SHONI, QUIJAS Feb 11, 2023  2:07 PM)    amoxicillin -clavulanate (AUGMENTIN ) 875-125 MG tablet [id number]  Take 1 tablet by mouth 2 (two) times daily.  Patient not taking: Reported on 09/13/2023   Oley Bascom RAMAN, NP  Active   Fremanezumab -vfrm (AJOVY ) 225 MG/1.5ML SOAJ [id number]  Inject 225 mg into the skin every 28 (twenty-eight) days.  Patient not taking: Reported on 09/13/2023   Skeet Juliene JONELLE, DO  Active   gabapentin (NEURONTIN) 300 MG capsule [id number] Yes TAKE 1 CAPSULE 3 TIMES A DAY BY ORAL ROUTE AS DIRECTED FOR 15 DAYS. [provider]  Active   hydrochlorothiazide  (HYDRODIURIL ) 25 MG tablet [id number] Yes Take 1 tablet (25 mg total) by mouth daily.  Oley Bascom RAMAN, NP  Active   lidocaine  (LIDODERM ) 5 % [id number]  Place 1 patch onto the skin daily. Remove & Discard patch within 12 hours or as directed by MD  Patient not taking: Reported on 09/13/2023   Theotis Cameron HERO, PA-C  Active   MY WAY 1.5 MG tablet [id number]  Take by mouth.  Patient not taking: Reported on 09/13/2023   [provider]  Active   ondansetron  (ZOFRAN ) 4 MG tablet [id number]  Take 1 tablet (4 mg total) by mouth every 6 (six) hours.  Patient not taking: Reported on 09/13/2023   Waylan Elsie PARAS, PA-C  Active   PARoxetine  (PAXIL ) 10 MG tablet [id number] Yes Take 1 tablet (10 mg total) by mouth daily. Oley Bascom RAMAN, NP  Active   predniSONE  (DELTASONE ) 20 MG tablet [id number] Yes Take 2 tablets (40 mg total) by mouth daily. Theotis Cameron M, PA-C  Active   rizatriptan  (MAXALT ) 10 MG tablet [id number] Yes Take 1 tablet (10 mg total) by mouth as needed for migraine. May repeat in 2 hours if needed.  Maximum 2 tablets in 24 hours. Skeet Juliene JONELLE, DO  Active   tiZANidine  (ZANAFLEX ) 4 MG tablet [id number] Yes Take 1 tablet (4 mg total) by mouth every 6 (six) hours as needed for muscle spasms. Oley Bascom RAMAN, NP  Active             Home Care and Equipment/Supplies: Were Home Health Services Ordered?: NA Any new equipment or medical supplies ordered?: NA  Functional Questionnaire: Do you need assistance with bathing/showering  or dressing?: No Do you need assistance with meal preparation?: No Do you need assistance with eating?: No Do you have difficulty maintaining continence: No Do you need assistance with getting out of bed/getting out of a chair/moving?: No Do you have difficulty managing or taking your medications?: No  Follow up appointments reviewed: PCP Follow-up appointment confirmed?: Yes Date of PCP follow-up appointment?: 09/14/23 Follow-up Provider: Bascom Borer Specialist [facility] Follow-up appointment confirmed?: NA Do you need  transportation to your follow-up appointment?: No Do you understand care options if your condition(s) worsen?: Yes-patient verbalized understanding  SDOH Interventions Today    Flowsheet Row Most Recent Value  SDOH Interventions   Food Insecurity Interventions Intervention Not Indicated  Housing Interventions Intervention Not Indicated  Transportation Interventions Intervention Not Indicated  Utilities Interventions Intervention Not Indicated    SIGNATURE Suzen Shove   CMA II

## 2023-09-14 ENCOUNTER — Ambulatory Visit: Payer: Self-pay | Admitting: Nurse Practitioner

## 2023-09-14 ENCOUNTER — Encounter: Payer: Self-pay | Admitting: Nurse Practitioner

## 2023-09-14 ENCOUNTER — Other Ambulatory Visit: Payer: Self-pay

## 2023-09-14 VITALS — BP 162/104 | HR 49 | Ht 69.0 in | Wt 181.0 lb

## 2023-09-14 DIAGNOSIS — M5432 Sciatica, left side: Secondary | ICD-10-CM

## 2023-09-14 DIAGNOSIS — M51362 Other intervertebral disc degeneration, lumbar region with discogenic back pain and lower extremity pain: Secondary | ICD-10-CM | POA: Diagnosis not present

## 2023-09-14 MED ORDER — CYCLOBENZAPRINE HCL 10 MG PO TABS
10.0000 mg | ORAL_TABLET | Freq: Three times a day (TID) | ORAL | 0 refills | Status: AC | PRN
  Filled 2023-09-14: qty 30, 10d supply, fill #0

## 2023-09-14 NOTE — Progress Notes (Signed)
 Subjective   Patient ID: Krista [name], female    DOB: 07/03/1975, 47 y.o.   MRN: [id number]  Chief Complaint  Patient presents with   Hospitalization Follow-up    Back pain    Referring provider: Oley Bascom RAMAN, NP  Krista [name] is a 48 y.o. female with Past Medical History: No date: Hypertension No date: Migraine   HPI  Patient presents today for an ED follow-up.  She was recently seen in the ED for low back pain.  This has been a chronic issue for her.  She was treated with prednisone  and Toradol  injection.  She completed prednisone  this morning.  States that she is still having left low back pain radiating to her left leg.  X-ray did show degenerative disc disease.  We will place referral to Ortho and trial Flexeril . Denies f/c/s, n/v/d, hemoptysis, PND, leg swelling Denies chest pain or edema          Allergies  Allergen Reactions   Valsartan  Swelling    Throat swelling and rash on leg, no SOB   Tea Swelling    Immunization History  Administered Date(s) Administered   Influenza, Seasonal, Injecte, Preservative Fre 09/14/2022   Tdap 12/13/2022    Tobacco History: Social History   Tobacco Use  Smoking Status Former   Types: Cigarettes  Smokeless Tobacco Never   Counseling given: Not Answered   Outpatient Encounter Medications as of 09/14/2023  Medication Sig   cyclobenzaprine  (FLEXERIL ) 10 MG tablet Take 1 tablet (10 mg total) by mouth 3 (three) times daily as needed for muscle spasms.   gabapentin (NEURONTIN) 300 MG capsule TAKE 1 CAPSULE 3 TIMES A DAY BY ORAL ROUTE AS DIRECTED FOR 15 DAYS.   hydrochlorothiazide  (HYDRODIURIL ) 25 MG tablet Take 1 tablet (25 mg total) by mouth daily.   PARoxetine  (PAXIL ) 10 MG tablet Take 1 tablet (10 mg total) by mouth daily.   rizatriptan  (MAXALT ) 10 MG tablet Take 1 tablet (10 mg total) by mouth as needed for migraine. May repeat in 2 hours if needed.  Maximum 2 tablets in 24 hours.   tiZANidine  (ZANAFLEX ) 4 MG  tablet Take 1 tablet (4 mg total) by mouth every 6 (six) hours as needed for muscle spasms.   amLODipine  (NORVASC ) 5 MG tablet Take 1 tablet (5 mg total) by mouth daily.   [DISCONTINUED] amoxicillin -clavulanate (AUGMENTIN ) 875-125 MG tablet Take 1 tablet by mouth 2 (two) times daily. (Patient not taking: Reported on 09/13/2023)   [DISCONTINUED] Fremanezumab -vfrm (AJOVY ) 225 MG/1.5ML SOAJ Inject 225 mg into the skin every 28 (twenty-eight) days. (Patient not taking: Reported on 09/13/2023)   [DISCONTINUED] lidocaine  (LIDODERM ) 5 % Place 1 patch onto the skin daily. Remove & Discard patch within 12 hours or as directed by MD (Patient not taking: Reported on 09/13/2023)   [DISCONTINUED] MY WAY 1.5 MG tablet Take by mouth. (Patient not taking: Reported on 09/13/2023)   [DISCONTINUED] ondansetron  (ZOFRAN ) 4 MG tablet Take 1 tablet (4 mg total) by mouth every 6 (six) hours. (Patient not taking: Reported on 09/13/2023)   [DISCONTINUED] predniSONE  (DELTASONE ) 20 MG tablet Take 2 tablets (40 mg total) by mouth daily.   No facility-administered encounter medications on file as of 09/14/2023.    Review of Systems  Review of Systems  Constitutional: Negative.   HENT: Negative.    Cardiovascular: Negative.   Gastrointestinal: Negative.   Musculoskeletal:  Positive for back pain.  Allergic/Immunologic: Negative.   Neurological: Negative.   Psychiatric/Behavioral: Negative.  Objective:   BP (!) 162/104   Pulse (!) 49   Ht 5' 9 (1.753 m)   Wt 181 lb (82.1 kg)   LMP 11/02/2021   SpO2 99%   BMI 26.73 kg/m   Wt Readings from Last 5 Encounters:  09/14/23 181 lb (82.1 kg)  09/11/23 180 lb (81.6 kg)  08/03/23 180 lb 6.4 oz (81.8 kg)  07/27/23 178 lb 3.2 oz (80.8 kg)  07/16/23 169 lb (76.7 kg)     Physical Exam Vitals and nursing note reviewed.  Constitutional:      General: She is not in acute distress.    Appearance: She is well-developed.  Cardiovascular:     Rate and Rhythm: Normal  rate and regular rhythm.  Pulmonary:     Effort: Pulmonary effort is normal.     Breath sounds: Normal breath sounds.  Musculoskeletal:     Lumbar back: Tenderness present. Decreased range of motion.       Back:  Neurological:     Mental Status: She is alert and oriented to person, place, and time.       Assessment & Plan:   Left sided sciatica -     Cyclobenzaprine  HCl; Take 1 tablet (10 mg total) by mouth 3 (three) times daily as needed for muscle spasms.  Dispense: 30 tablet; Refill: 0 -     Ambulatory referral to Orthopedic Surgery  Degeneration of intervertebral disc of lumbar region with discogenic back pain and lower extremity pain -     Ambulatory referral to Orthopedic Surgery     Return in about 3 months (around 12/14/2023).   Bascom GORMAN Borer, NP 09/14/2023

## 2023-12-18 ENCOUNTER — Encounter: Payer: Self-pay | Admitting: Physical Medicine and Rehabilitation

## 2023-12-18 ENCOUNTER — Ambulatory Visit: Admitting: Physical Medicine and Rehabilitation

## 2023-12-18 DIAGNOSIS — M5441 Lumbago with sciatica, right side: Secondary | ICD-10-CM | POA: Diagnosis not present

## 2023-12-18 DIAGNOSIS — M5416 Radiculopathy, lumbar region: Secondary | ICD-10-CM | POA: Diagnosis not present

## 2023-12-18 DIAGNOSIS — M961 Postlaminectomy syndrome, not elsewhere classified: Secondary | ICD-10-CM

## 2023-12-18 DIAGNOSIS — G8929 Other chronic pain: Secondary | ICD-10-CM

## 2023-12-18 MED ORDER — TRAMADOL HCL 50 MG PO TABS: 50.0000 mg | ORAL_TABLET | Freq: Three times a day (TID) | ORAL | 0 refills | Status: AC | PRN

## 2023-12-18 NOTE — Progress Notes (Unsigned)
 Core Outcome Measures Index (COMI) Back Score  Average Pain 10  COMI Score 90 %

## 2023-12-18 NOTE — Progress Notes (Unsigned)
 Krista [name] - 48 y.o. female MRN [id number]  Date of birth: [birth year]  Office Visit Note: Visit Date: 12/18/2023 PCP: Krista [name] RAMAN, NP Referred by: Krista [name] RAMAN, NP  Subjective: Chief Complaint  Patient presents with   Lower Back - Pain   HPI: Krista [name] is a 47 y.o. female who comes in today per the request of [name] Oley, NP for evaluation of chronic, worsening and severe bilateral lower back pain radiating down posterolateral leg to dorsum of foot. Also reports chronic numbness/tingling to bilateral feet. Pain ongoing for several years, worsened in September. Her pain becomes severe when laying flat to sleep. She describes pain as stabbing and grabbing sensation, currently rates as 10 out of 10. Some relief of pain with home exercise regimen, rest and use of medications. Minimal relief of pain with Flexeril  and Lidocaine  patches. History of formal physical therapy with minimal relief of pain. She was evaluated in the emergency department on 09/11/2023. Recent lumbar radiographs show degenerative disc and facet disease in the lower lumbar spine. History of lumbar laminectomy/decompression and microdiscectomy for left lateral disc protrusion at L5-S1. No history of lumbar injections. States she did well for about 6 months post op, however her pain returned on the right side. Patient denies focal weakness. No recent trauma or falls. No bowel/bladder incontinence      Review of Systems  Musculoskeletal:  Positive for back pain.  Neurological:  Positive for tingling. Negative for focal weakness and weakness.  All other systems reviewed and are negative.  Otherwise per HPI.  Assessment & Plan: Visit Diagnoses:    ICD-10-CM   1. Chronic bilateral low back pain with right-sided sciatica  G89.29 MR LUMBAR SPINE WO CONTRAST   M54.41     2. Lumbar radiculopathy  M54.16 MR LUMBAR SPINE WO CONTRAST    3. Post laminectomy syndrome  M96.1 MR LUMBAR SPINE WO CONTRAST       Plan:  Findings:  Chronic, worsening and severe bilateral lower back pain radiating down posterolateral leg to dorsum of foot. Chronic paresthesias to bilateral feet. Patient continues to have severe pain despite good conservative therapies such as formal physical therapy, home exercise regimen, rest and use of medications. Patients clinical presentation and exam are consistent with lumbar radiculopathy, more of L5 nerve pattern. Recent lumbar radiographs show degenerative changes and facet arthropathy to lower lumbar spine. We discussed treatment plan in detail today. Next step is to place order for lumbar MRI imaging. Depending on results of MRI imaging we discussed possibility of performing lumbar epidural steroid injection. I will see her back for lumbar MRI review. I do think she would also be a good candidate for spinal cord stimulation, given her history of failed conservative therapies and surgery. We will discuss SCS further when I see her back. Her exam today is non focal, good strength to bilateral lower extremities.     Meds & Orders:  Meds ordered this encounter  Medications   traMADol  (ULTRAM ) 50 MG tablet    Sig: Take 1 tablet (50 mg total) by mouth every 8 (eight) hours as needed.    Dispense:  20 tablet    Refill:  0    Orders Placed This Encounter  Procedures   MR LUMBAR SPINE WO CONTRAST    Follow-up: Return for Lumbar MRI review.   Procedures: No procedures performed      Clinical History: Narrative & Impression CLINICAL DATA:  Midline low back pain.  No known injury.  EXAM: LUMBAR SPINE - COMPLETE 4+ VIEW   COMPARISON:  None Available.   FINDINGS: Disc space narrowing at L5-S1. Normal alignment. Mild degenerative facet disease at L4-5 and L5-S1. No fracture.   IMPRESSION: Degenerative disc and facet disease in the lower lumbar spine. No acute bony abnormality.     Electronically Signed   By: Franky Crease M.D.   On: 09/11/2023 19:4   She reports that she has  quit smoking. Her smoking use included cigarettes. She has never used smokeless tobacco. No results for input(s): HGBA1C, LABURIC in the last 8760 hours.  Objective:  VS:  HT:    WT:   BMI:     BP:   HR: bpm  TEMP: ( )  RESP:  Physical Exam Vitals and nursing note reviewed.  HENT:     Head: Normocephalic and atraumatic.     Right Ear: External ear normal.     Left Ear: External ear normal.     Nose: Nose normal.     Mouth/Throat:     Mouth: Mucous membranes are moist.  Eyes:     Extraocular Movements: Extraocular movements intact.  Cardiovascular:     Rate and Rhythm: Normal rate.     Pulses: Normal pulses.  Pulmonary:     Effort: Pulmonary effort is normal.  Abdominal:     General: Abdomen is flat. There is no distension.  Musculoskeletal:        General: Tenderness present.     Cervical back: Normal range of motion.     Comments: Patient rises from seated position to standing without difficulty. Good lumbar range of motion. No pain noted with facet loading. 5/5 strength noted with bilateral hip flexion, knee flexion/extension, ankle dorsiflexion/plantarflexion and EHL. No clonus noted bilaterally. No pain upon palpation of greater trochanters. No pain with internal/external rotation of bilateral hips. Sensation intact bilaterally. Dysesthesias noted to right L5 dermatome. Positive slump test on the right. Ambulates without aid, gait steady.     Skin:    General: Skin is warm and dry.     Capillary Refill: Capillary refill takes less than 2 seconds.  Neurological:     General: No focal deficit present.     Mental Status: She is alert and oriented to person, place, and time.  Psychiatric:        Mood and Affect: Mood normal.        Behavior: Behavior normal.     Ortho Exam  Imaging: No results found.  Past Medical/Family/Surgical/Social History: Medications & Allergies reviewed per EMR, new medications updated. Patient Active Problem List   Diagnosis Date Noted    Need for influenza vaccination 09/14/2022   Hot flash, menopausal 09/06/2022   UTI symptoms 07/05/2022   Colon cancer screening 01/26/2022   Essential hypertension 06/27/2020   Menorrhagia 05/26/2020   Uterine fibroid 05/26/2020   Past Medical History:  Diagnosis Date   Hypertension    Migraine    Family History  Problem Relation Age of Onset   Cancer Mother    Diabetes Mother    Hypertension Mother    Hypertension Father    Seizures Sister    Breast cancer Sister    Breast cancer Maternal Grandmother    Breast cancer Paternal Grandmother    Past Surgical History:  Procedure Laterality Date   LUMBAR LAMINECTOMY/DECOMPRESSION MICRODISCECTOMY N/A 07/01/2020   Procedure: LUMBAR LAMINECTOMY/DECOMPRESSION MICRODISCECTOMY 1 LEVEL;  Surgeon: Burnetta Aures, MD;  Location: [facility] OR;  Service: Orthopedics;  Laterality: N/A;  NO PAST SURGERIES     Social History   Occupational History   Not on file  Tobacco Use   Smoking status: Former    Types: Cigarettes   Smokeless tobacco: Never  Vaping Use   Vaping status: Never Used  Substance and Sexual Activity   Alcohol use: No   Drug use: Not Currently    Types: Marijuana   Sexual activity: Yes    Partners: Male    Birth control/protection: None

## 2023-12-18 NOTE — Progress Notes (Unsigned)
 Pain Scale   Average Pain 10 Patient advising she has lower back pain radiating to right side        +Driver, -BT, -Dye Allergies.

## 2023-12-31 ENCOUNTER — Encounter: Payer: Self-pay | Admitting: Physical Medicine and Rehabilitation

## 2024-01-06 ENCOUNTER — Other Ambulatory Visit

## 2024-01-14 ENCOUNTER — Other Ambulatory Visit: Payer: Self-pay | Admitting: Physical Medicine and Rehabilitation

## 2024-01-14 DIAGNOSIS — G8929 Other chronic pain: Secondary | ICD-10-CM

## 2024-01-14 DIAGNOSIS — M5416 Radiculopathy, lumbar region: Secondary | ICD-10-CM

## 2024-01-18 ENCOUNTER — Other Ambulatory Visit

## 2024-01-21 ENCOUNTER — Other Ambulatory Visit: Payer: Self-pay

## 2024-01-21 ENCOUNTER — Ambulatory Visit: Attending: Physical Medicine and Rehabilitation

## 2024-01-21 DIAGNOSIS — M6281 Muscle weakness (generalized): Secondary | ICD-10-CM | POA: Diagnosis present

## 2024-01-21 DIAGNOSIS — M5441 Lumbago with sciatica, right side: Secondary | ICD-10-CM | POA: Insufficient documentation

## 2024-01-21 DIAGNOSIS — M2560 Stiffness of unspecified joint, not elsewhere classified: Secondary | ICD-10-CM | POA: Insufficient documentation

## 2024-01-21 DIAGNOSIS — G8929 Other chronic pain: Secondary | ICD-10-CM | POA: Diagnosis present

## 2024-01-21 DIAGNOSIS — M5416 Radiculopathy, lumbar region: Secondary | ICD-10-CM | POA: Diagnosis not present
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
## 2024-01-28 ENCOUNTER — Ambulatory Visit: Payer: Self-pay | Admitting: Nurse Practitioner

## 2024-01-29 ENCOUNTER — Ambulatory Visit

## 2024-02-01 ENCOUNTER — Ambulatory Visit: Admitting: Physical Therapy

## 2024-02-04 ENCOUNTER — Ambulatory Visit: Admitting: Physical Therapy

## 2024-02-08 ENCOUNTER — Ambulatory Visit: Admitting: Physical Therapy

## 2024-02-11 ENCOUNTER — Ambulatory Visit: Admitting: Physical Therapy

## 2024-02-15 ENCOUNTER — Ambulatory Visit

## 2024-02-18 ENCOUNTER — Ambulatory Visit: Admitting: Physical Therapy

## 2024-02-25 ENCOUNTER — Ambulatory Visit: Payer: Self-pay | Admitting: Nurse Practitioner
